# Patient Record
Sex: Female | Born: 1980 | Race: White | Hispanic: No | Marital: Single | Smoking: Current every day smoker
Health system: Southern US, Community
[De-identification: ages and names within clinical notes are randomized; demographics above are authoritative.]

## PROBLEM LIST (undated history)

## (undated) DIAGNOSIS — F410 Panic disorder [episodic paroxysmal anxiety] without agoraphobia: Secondary | ICD-10-CM

## (undated) DIAGNOSIS — F419 Anxiety disorder, unspecified: Secondary | ICD-10-CM

---

## 2005-03-18 ENCOUNTER — Other Ambulatory Visit: Admission: RE | Admit: 2005-03-18 | Discharge: 2005-03-18 | Payer: Self-pay

## 2009-05-08 ENCOUNTER — Other Ambulatory Visit: Admission: RE | Admit: 2009-05-08 | Discharge: 2009-05-08 | Payer: Self-pay | Admitting: Unknown Physician Specialty

## 2011-04-09 ENCOUNTER — Emergency Department (HOSPITAL_COMMUNITY)
Admission: EM | Admit: 2011-04-09 | Discharge: 2011-04-09 | Disposition: A | Discharge status: Home / Self Care | Payer: Self-pay | Attending: Emergency Medicine | Admitting: Emergency Medicine

## 2011-04-09 ENCOUNTER — Encounter (HOSPITAL_COMMUNITY): Payer: Self-pay | Admitting: *Deleted

## 2011-04-09 DIAGNOSIS — F17200 Nicotine dependence, unspecified, uncomplicated: Secondary | ICD-10-CM | POA: Insufficient documentation

## 2011-04-09 DIAGNOSIS — K089 Disorder of teeth and supporting structures, unspecified: Secondary | ICD-10-CM | POA: Insufficient documentation

## 2011-04-09 DIAGNOSIS — G8929 Other chronic pain: Secondary | ICD-10-CM | POA: Insufficient documentation

## 2011-04-09 MED ORDER — IBUPROFEN 800 MG PO TABS
800.0000 mg | ORAL_TABLET | Freq: Once | ORAL | Status: AC
  Administered 2011-04-09: 800 mg via ORAL
  Filled 2011-04-09: qty 1

## 2011-04-09 MED ORDER — HYDROCODONE-ACETAMINOPHEN 5-325 MG PO TABS
1.0000 | ORAL_TABLET | Freq: Once | ORAL | Status: AC
  Administered 2011-04-09: 1 via ORAL
  Filled 2011-04-09: qty 1

## 2011-04-09 MED ORDER — PENICILLIN V POTASSIUM 500 MG PO TABS: 500.0000 mg | ORAL_TABLET | Freq: Four times a day (QID) | ORAL | Status: AC

## 2011-04-09 MED ORDER — HYDROCODONE-ACETAMINOPHEN 5-325 MG PO TABS: 1.0000 | ORAL_TABLET | Freq: Four times a day (QID) | ORAL | Status: AC | PRN

## 2011-04-09 MED ORDER — PENICILLIN V POTASSIUM 250 MG PO TABS
500.0000 mg | ORAL_TABLET | Freq: Once | ORAL | Status: AC
  Administered 2011-04-09: 500 mg via ORAL
  Filled 2011-04-09: qty 2

## 2011-04-09 NOTE — ED Provider Notes (Signed)
History     CSN: [id number]  Arrival date & time 04/09/11  1558   First MD Initiated Contact with Patient 04/09/11 1634      Chief Complaint  Patient presents with  . Dental Pain    (Consider location/radiation/quality/duration/timing/severity/associated sxs/prior treatment) HPI Comments: Pt plans to go to the next free dental clinic either in [place] or [place].  ? Dates.  Patient is a 31 y.o. female presenting with tooth pain. The history is provided by the patient. No language interpreter was used.  Dental PainThe primary symptoms include mouth pain. Primary symptoms do not include dental injury. Episode onset: began several weeks ago but getting worse in past few days. The symptoms are worsening. The symptoms are chronic. The symptoms occur constantly.  Additional symptoms include: dental sensitivity to temperature and jaw pain. Additional symptoms do not include: facial swelling.    History reviewed. No pertinent past medical history.  History reviewed. No pertinent past surgical history.  No family history on file.  History  Substance Use Topics  . Smoking status: Current Everyday Smoker  . Smokeless tobacco: Not on file  . Alcohol Use: No    OB History    Grav Para Term Preterm Abortions TAB SAB Ect Mult Living                  Review of Systems  HENT: Positive for dental problem. Negative for facial swelling.   All other systems reviewed and are negative.    Allergies  Review of patient's allergies indicates no known allergies.  Home Medications  No current outpatient prescriptions on file.  BP 112/85  Pulse 89  Temp(Src) 98.9 F (37.2 C) (Oral)  Resp 16  Ht 5' (1.524 m)  Wt 130 lb (58.968 kg)  BMI 25.39 kg/m2  SpO2 99%  Physical Exam  Nursing note and vitals reviewed. Constitutional: She is oriented to person, place, and time. She appears well-developed and well-nourished. No distress.  HENT:  Head: Normocephalic and atraumatic.    Mouth/Throat: Oropharynx is clear and moist and mucous membranes are normal. Dental caries present. No uvula swelling.    Eyes: EOM are normal.  Neck: Normal range of motion.  Cardiovascular: Normal rate, regular rhythm and normal heart sounds.   Pulmonary/Chest: Effort normal and breath sounds normal.  Abdominal: Soft. She exhibits no distension. There is no tenderness.  Musculoskeletal: Normal range of motion.  Neurological: She is alert and oriented to person, place, and time.  Skin: Skin is warm and dry.  Psychiatric: She has a normal mood and affect. Judgment normal.    ED Course  Procedures (including critical care time)  Labs Reviewed - No data to display No results found.   No diagnosis found.    MDM          Worthy Rancher, PA 04/09/11 [phone]

## 2011-04-09 NOTE — Discharge Instructions (Signed)
Dental Pain A tooth ache may be caused by cavities (tooth decay). Cavities expose the nerve of the tooth to air and hot or cold temperatures. It may come from an infection or abscess (also called a boil or furuncle) around your tooth. It is also often caused by dental caries (tooth decay). This causes the pain you are having. DIAGNOSIS  Your caregiver can diagnose this problem by exam. TREATMENT   If caused by an infection, it may be treated with medications which kill germs (antibiotics) and pain medications as prescribed by your caregiver. Take medications as directed.   Only take over-the-counter or prescription medicines for pain, discomfort, or fever as directed by your caregiver.   Whether the tooth ache today is caused by infection or dental disease, you should see your dentist as soon as possible for further care.  SEEK MEDICAL CARE IF: The exam and treatment you received today has been provided on an emergency basis only. This is not a substitute for complete medical or dental care. If your problem worsens or new problems (symptoms) appear, and you are unable to meet with your dentist, call or return to this location. SEEK IMMEDIATE MEDICAL CARE IF:   You have a fever.   You develop redness and swelling of your face, jaw, or neck.   You are unable to open your mouth.   You have severe pain uncontrolled by pain medicine.  MAKE SURE YOU:   Understand these instructions.   Will watch your condition.   Will get help right away if you are not doing well or get worse.  Document Released: 02/03/2005 Document Revised: 10/16/2010 Document Reviewed: 09/22/2007 [facility] Patient Information 2012 [place], [place].   Take the meds as directed.  Take ibuprofen up to 800 mg every 8 hrs with food.  Follow up with the dentist of your choice.

## 2011-04-09 NOTE — ED Notes (Signed)
Dental pain x 2 weeks.

## 2011-04-09 NOTE — ED Notes (Signed)
Pt states front teeth and back left teeth pain. Pt states teeth pain started about 3 weeks ago. Has taken over the counter medication without relief.

## 2011-04-09 NOTE — ED Provider Notes (Signed)
Medical screening examination/treatment/procedure(s) were performed by non-physician practitioner and as supervising physician I was immediately available for consultation/collaboration.   Andreah Goheen L Treshaun Carrico, MD 04/09/11 2239 

## 2011-12-15 ENCOUNTER — Emergency Department (HOSPITAL_COMMUNITY)
Admission: EM | Admit: 2011-12-15 | Discharge: 2011-12-15 | Disposition: A | Discharge status: Home / Self Care | Payer: Self-pay | Attending: Emergency Medicine | Admitting: Emergency Medicine

## 2011-12-15 ENCOUNTER — Encounter (HOSPITAL_COMMUNITY): Payer: Self-pay | Admitting: *Deleted

## 2011-12-15 DIAGNOSIS — F410 Panic disorder [episodic paroxysmal anxiety] without agoraphobia: Secondary | ICD-10-CM | POA: Insufficient documentation

## 2011-12-15 DIAGNOSIS — F419 Anxiety disorder, unspecified: Secondary | ICD-10-CM

## 2011-12-15 DIAGNOSIS — F17200 Nicotine dependence, unspecified, uncomplicated: Secondary | ICD-10-CM | POA: Insufficient documentation

## 2011-12-15 DIAGNOSIS — Z79899 Other long term (current) drug therapy: Secondary | ICD-10-CM | POA: Insufficient documentation

## 2011-12-15 HISTORY — DX: Anxiety disorder, unspecified: F41.9

## 2011-12-15 HISTORY — DX: Panic disorder (episodic paroxysmal anxiety): F41.0

## 2011-12-15 MED ORDER — LORAZEPAM 1 MG PO TABS
1.0000 mg | ORAL_TABLET | Freq: Once | ORAL | Status: AC
  Administered 2011-12-15: 1 mg via ORAL
  Filled 2011-12-15: qty 1

## 2011-12-15 MED ORDER — LORAZEPAM 1 MG PO TABS: 1.0000 mg | ORAL_TABLET | Freq: Three times a day (TID) | ORAL | Status: DC | PRN

## 2011-12-15 NOTE — ED Provider Notes (Signed)
History     CSN: [id number]  Arrival date & time 12/15/11  1632   First MD Initiated Contact with Patient 12/15/11 1651      Chief Complaint  Patient presents with  . Panic Attack     HPI Pt was seen at 1700.  Per pt, c/o gradual onset and persistence of constant "panic attack" that began today PTA.  Pt describes her usual panic attacks as her "hands and mouth cramping" as well as "grinding her teeth."  Pt states her symptoms began after she ran out of her xanax, LD yesterday. Denies HI, no SI, no CP/SOB, no abd pain, no N/V/D, no fevers.      Past Medical History  Diagnosis Date  . Panic attack   . Anxiety   . Anxiety   . Panic attack     History reviewed. No pertinent past surgical history.   History  Substance Use Topics  . Smoking status: Current Every Day Smoker  . Smokeless tobacco: Not on file  . Alcohol Use: No      Review of Systems ROS: Statement: All systems negative except as marked or noted in the HPI; Constitutional: Negative for fever and chills. ; ; Eyes: Negative for eye pain, redness and discharge. ; ; ENMT: Negative for ear pain, hoarseness, nasal congestion, sinus pressure and sore throat. ; ; Cardiovascular: Negative for chest pain, palpitations, diaphoresis, dyspnea and peripheral edema. ; ; Respiratory: Negative for cough, wheezing and stridor. ; ; Gastrointestinal: Negative for nausea, vomiting, diarrhea, abdominal pain, blood in stool, hematemesis, jaundice and rectal bleeding. . ; ; Genitourinary: Negative for dysuria, flank pain and hematuria. ; ; Musculoskeletal: Negative for back pain and neck pain. Negative for swelling and trauma.; ; Skin: Negative for pruritus, rash, abrasions, blisters, bruising and skin lesion.; ; Neuro: Negative for headache, lightheadedness and neck stiffness. Negative for weakness, altered level of consciousness , altered mental status, extremity weakness, paresthesias, involuntary movement, seizure and syncope.; Psych:   +anxiety, panic attack. No SI, no SA, no HI, no hallucinations.        Allergies  Review of patient's allergies indicates no known allergies.  Home Medications   Current Outpatient Rx  Name Route Sig Dispense Refill  . ACETAMINOPHEN 500 MG PO TABS Oral Take 500-2,000 mg by mouth daily as needed. For pain    . ALPRAZOLAM 1 MG PO TABS Oral Take 1 mg by mouth 3 (three) times daily as needed. For anxiety    . DULOXETINE HCL 30 MG PO CPEP Oral Take 30 mg by mouth daily.    Marland Kitchen RISPERIDONE 1 MG PO TABS Oral Take 1 mg by mouth at bedtime.    . TRAZODONE HCL 150 MG PO TABS Oral Take 150 mg by mouth at bedtime.    Marland Kitchen LORAZEPAM 1 MG PO TABS Oral Take 1 tablet (1 mg total) by mouth 3 (three) times daily as needed for anxiety. 9 tablet 0    BP 108/68  Pulse 76  Resp 16  Ht 5\' 4"  (1.626 m)  Wt 143 lb (64.864 kg)  BMI 24.55 kg/m2  SpO2 100%  Physical Exam 1705: Physical examination:  Nursing notes reviewed; Vital signs and O2 SAT reviewed;  Constitutional: Well developed, Well nourished, Well hydrated, In no acute distress; Head:  Normocephalic, atraumatic; Eyes: EOMI, PERRL, No scleral icterus; ENMT: Mouth and pharynx normal, Mucous membranes moist; Neck: Supple, Full range of motion, No lymphadenopathy; Cardiovascular: Regular rate and rhythm, No murmur, rub, or gallop; Respiratory: Breath  sounds clear & equal bilaterally, No rales, rhonchi, wheezes.  Speaking full sentences with ease, Normal respiratory effort/excursion; Chest: Nontender, Movement normal; No CVA tenderness; Extremities: Pulses normal, No tenderness, No edema, No calf edema or asymmetry.; Neuro: AA&Ox3, Major CN grossly intact.  Speech clear. No gross focal motor or sensory deficits in extremities.; Skin: Color normal, Warm, Dry.; Psych:  Affect flat, poor eye contact.    ED Course  Procedures   MDM  MDM Reviewed: nursing note and vitals     1715:  Pt states she has been out of her xanax since yesterday to myself and ED  RN, but told pharm tech she has not had xanax for approx 1 week.  States she is "having a panic attack" today.  Pt is chewing on the end of a toothbrush because "I don't want to swallow my tongue" as well as "I grind my teeth" when she is upset.  Will dose ativan.  1945:  Pt has been ambulatory around the ED with steady gait and easy resps.  Wants to leave now.  States she feels "better."  Continues to deny SI. Dx d/w pt and family.  Questions answered.  Verb understanding, agreeable to d/c home with outpt f/u with her mental health provider.        Laray Anger, DO 12/18/11 1252

## 2011-12-15 NOTE — ED Notes (Signed)
Pt states has had anxiety attack x 2 hrs. Pt found sitting [place] style on stretcher, with left arm bent over the top of her head and the end of a tooth brush in her mouth, pt states " toothbrush in  mouth is to prevent me from swallowing my tongue and my arm and hand keeps cramping up". Panic attack started 2 hrs ago because of pain in her mouth and hand per pt. Pt states first she took a xanax yesterday and later stated she took one today, however told pharmacy tech she hadn't had any xanax for a week. No SOB, hyperventilation or distress noted.  EDP notified of pt.

## 2011-12-15 NOTE — ED Notes (Signed)
Family at bedside. Patient is waiting on discharge 

## 2011-12-15 NOTE — ED Notes (Signed)
Family at bedside. Patient states she is in pain RN aware.

## 2011-12-15 NOTE — ED Notes (Signed)
Pt out to desk, states feels better. "was wondering about DC papers.

## 2011-12-15 NOTE — ED Notes (Signed)
Anxiety for 2 hours with cramping of hands, and mouth.

## 2011-12-18 ENCOUNTER — Encounter (HOSPITAL_COMMUNITY): Payer: Self-pay | Admitting: Emergency Medicine

## 2012-05-07 ENCOUNTER — Encounter (HOSPITAL_COMMUNITY): Payer: Self-pay | Admitting: *Deleted

## 2012-05-07 ENCOUNTER — Emergency Department (HOSPITAL_COMMUNITY)
Admission: EM | Admit: 2012-05-07 | Discharge: 2012-05-07 | Disposition: A | Discharge status: Home / Self Care | Payer: Self-pay | Attending: Emergency Medicine | Admitting: Emergency Medicine

## 2012-05-07 DIAGNOSIS — F411 Generalized anxiety disorder: Secondary | ICD-10-CM | POA: Insufficient documentation

## 2012-05-07 DIAGNOSIS — Z79899 Other long term (current) drug therapy: Secondary | ICD-10-CM | POA: Insufficient documentation

## 2012-05-07 DIAGNOSIS — K029 Dental caries, unspecified: Secondary | ICD-10-CM | POA: Insufficient documentation

## 2012-05-07 DIAGNOSIS — F410 Panic disorder [episodic paroxysmal anxiety] without agoraphobia: Secondary | ICD-10-CM | POA: Insufficient documentation

## 2012-05-07 DIAGNOSIS — F17200 Nicotine dependence, unspecified, uncomplicated: Secondary | ICD-10-CM | POA: Insufficient documentation

## 2012-05-07 DIAGNOSIS — R220 Localized swelling, mass and lump, head: Secondary | ICD-10-CM | POA: Insufficient documentation

## 2012-05-07 DIAGNOSIS — R51 Headache: Secondary | ICD-10-CM | POA: Insufficient documentation

## 2012-05-07 DIAGNOSIS — R221 Localized swelling, mass and lump, neck: Secondary | ICD-10-CM | POA: Insufficient documentation

## 2012-05-07 DIAGNOSIS — K047 Periapical abscess without sinus: Secondary | ICD-10-CM | POA: Insufficient documentation

## 2012-05-07 MED ORDER — LIDOCAINE HCL (PF) 2 % IJ SOLN
INTRAMUSCULAR | Status: AC
  Administered 2012-05-07: 2.1 mL
  Filled 2012-05-07: qty 10

## 2012-05-07 MED ORDER — CEFTRIAXONE SODIUM 1 G IJ SOLR
1.0000 g | Freq: Once | INTRAMUSCULAR | Status: AC
  Administered 2012-05-07: 1 g via INTRAMUSCULAR
  Filled 2012-05-07: qty 10

## 2012-05-07 MED ORDER — HYDROCODONE-ACETAMINOPHEN 5-325 MG PO TABS: 1.0000 | ORAL_TABLET | ORAL | Status: DC | PRN

## 2012-05-07 MED ORDER — AMOXICILLIN 500 MG PO CAPS: 500.0000 mg | ORAL_CAPSULE | Freq: Three times a day (TID) | ORAL | Status: DC

## 2012-05-07 MED ORDER — IBUPROFEN 600 MG PO TABS: 600.0000 mg | ORAL_TABLET | Freq: Four times a day (QID) | ORAL | Status: DC | PRN

## 2012-05-07 MED ORDER — KETOROLAC TROMETHAMINE 60 MG/2ML IM SOLN
30.0000 mg | Freq: Once | INTRAMUSCULAR | Status: AC
  Administered 2012-05-07: 30 mg via INTRAMUSCULAR
  Filled 2012-05-07: qty 2

## 2012-05-07 NOTE — ED Provider Notes (Signed)
Medical screening examination/treatment/procedure(s) were performed by non-physician practitioner and as supervising physician I was immediately available for consultation/collaboration.   Ellary Casamento L Linea Calles, MD 05/07/12 1636 

## 2012-05-07 NOTE — ED Notes (Signed)
Pt reports extreme dental pain/problems for the past 9 months - teeth breaking, pain, swelling to face - more severe the past two days. Is seeking assistance from health department and trying to obtain medicaid.

## 2012-05-07 NOTE — ED Provider Notes (Signed)
History     CSN: [id number]  Arrival date & time 05/07/12  1540   First MD Initiated Contact with Patient 05/07/12 1549      Chief Complaint  Patient presents with  . Dental Pain    (Consider location/radiation/quality/duration/timing/severity/associated sxs/prior treatment) HPI Kendra Ho is a 32 y.o. female who presents to the ED with dental pain. The pain started yesterday. Today she has swelling and increased pain on the right side of her face. She has not had fever or chills. She rates the pain as 9/10. She denies any other problems. The history was provided by the patient.  Past Medical History  Diagnosis Date  . Panic attack   . Anxiety   . Anxiety   . Panic attack     History reviewed. No pertinent past surgical history.  History reviewed. No pertinent family history.  History  Substance Use Topics  . Smoking status: Current Every Day Smoker  . Smokeless tobacco: Not on file  . Alcohol Use: No    OB History   Grav Para Term Preterm Abortions TAB SAB Ect Mult Living                  Review of Systems  Constitutional: Negative for fever and chills.  HENT: Positive for dental problem. Negative for neck pain.   Gastrointestinal: Negative for nausea, vomiting and abdominal pain.  Skin: Negative for rash.  Neurological: Positive for headaches (due to dental pain).  Psychiatric/Behavioral: Negative for confusion. The patient is not nervous/anxious.     Allergies  Review of patient's allergies indicates no known allergies.  Home Medications   Current Outpatient Rx  Name  Route  Sig  Dispense  Refill  . acetaminophen (TYLENOL) 500 MG tablet   Oral   Take 500-2,000 mg by mouth daily as needed. For pain         . ALPRAZolam (XANAX) 1 MG tablet   Oral   Take 1 mg by mouth 3 (three) times daily as needed. For anxiety         . DULoxetine (CYMBALTA) 30 MG capsule   Oral   Take 30 mg by mouth daily.         Marland Kitchen LORazepam (ATIVAN) 1 MG  tablet   Oral   Take 1 tablet (1 mg total) by mouth 3 (three) times daily as needed for anxiety.   9 tablet   0   . risperiDONE (RISPERDAL) 1 MG tablet   Oral   Take 1 mg by mouth at bedtime.         . traZODone (DESYREL) 150 MG tablet   Oral   Take 150 mg by mouth at bedtime.           BP 114/79  Pulse 76  Temp(Src) 99 F (37.2 C) (Oral)  Resp 18  Ht 5' (1.524 m)  Wt 130 lb (58.968 kg)  BMI 25.39 kg/m2  SpO2 99%  Physical Exam  Nursing note and vitals reviewed. Constitutional: She is oriented to person, place, and time. She appears well-developed and well-nourished. No distress.  Appears uncomfortable.  HENT:  Head: Normocephalic and atraumatic.    Right Ear: Tympanic membrane normal.  Left Ear: Tympanic membrane normal.  Nose: Nose normal.  Mouth/Throat: Uvula is midline, oropharynx is clear and moist and mucous membranes are normal. Dental abscesses and dental caries present.    Facial swelling and tenderness on exam  Eyes: Conjunctivae and EOM are normal. Pupils are equal, round,  and reactive to light.  Neck: Neck supple.  Cardiovascular: Normal rate.   Pulmonary/Chest: Effort normal.  Musculoskeletal: Normal range of motion. She exhibits no edema.  Neurological: She is alert and oriented to person, place, and time. No cranial nerve deficit.  Skin: Skin is warm and dry.  Psychiatric: She has a normal mood and affect. Her behavior is normal. Judgment and thought content normal.   Assessment: 32 y.o. female with dental abscess right upper first molar   Facial swelling  Plan:  Rocephin 1 gram IM   Toradol 30 mg IM   Rx antibiotics   Rx pain medicine   Follow up with Dentist ASAP   Return if symptoms worsen  ED Course  Procedures (including critical care time)   MDM  Discussed with the patient and all questioned fully answered. She will return if any problems arise.    Medication List    TAKE these medications       amoxicillin 500 MG capsule   Commonly known as:  AMOXIL  Take 1 capsule (500 mg total) by mouth 3 (three) times daily.     HYDROcodone-acetaminophen 5-325 MG per tablet  Commonly known as:  NORCO/VICODIN  Take 1 tablet by mouth every 4 (four) hours as needed.     ibuprofen 600 MG tablet  Commonly known as:  ADVIL,MOTRIN  Take 1 tablet (600 mg total) by mouth every 6 (six) hours as needed for pain.      ASK your doctor about these medications       acetaminophen 500 MG tablet  Commonly known as:  TYLENOL  Take 500-2,000 mg by mouth daily as needed. For pain     ALPRAZolam 1 MG tablet  Commonly known as:  XANAX  Take 1 mg by mouth 3 (three) times daily as needed. For anxiety     DULoxetine 30 MG capsule  Commonly known as:  CYMBALTA  Take 30 mg by mouth daily.     LORazepam 1 MG tablet  Commonly known as:  ATIVAN  Take 1 tablet (1 mg total) by mouth 3 (three) times daily as needed for anxiety.     risperiDONE 1 MG tablet  Commonly known as:  RISPERDAL  Take 1 mg by mouth at bedtime.     traZODone 150 MG tablet  Commonly known as:  DESYREL  Take 150 mg by mouth at bedtime.                 Janne Napoleon, [place] 05/07/12 1635

## 2012-05-07 NOTE — ED Notes (Signed)
Dental pain with swelling rt side of face.

## 2012-07-22 ENCOUNTER — Encounter (HOSPITAL_COMMUNITY): Payer: Self-pay | Admitting: Emergency Medicine

## 2012-07-22 ENCOUNTER — Emergency Department (HOSPITAL_COMMUNITY)
Admission: EM | Admit: 2012-07-22 | Discharge: 2012-07-22 | Disposition: A | Discharge status: Home / Self Care | Payer: Self-pay | Attending: Emergency Medicine | Admitting: Emergency Medicine

## 2012-07-22 DIAGNOSIS — Z79899 Other long term (current) drug therapy: Secondary | ICD-10-CM | POA: Insufficient documentation

## 2012-07-22 DIAGNOSIS — F411 Generalized anxiety disorder: Secondary | ICD-10-CM | POA: Insufficient documentation

## 2012-07-22 DIAGNOSIS — K029 Dental caries, unspecified: Secondary | ICD-10-CM | POA: Insufficient documentation

## 2012-07-22 DIAGNOSIS — Z792 Long term (current) use of antibiotics: Secondary | ICD-10-CM | POA: Insufficient documentation

## 2012-07-22 DIAGNOSIS — K0889 Other specified disorders of teeth and supporting structures: Secondary | ICD-10-CM

## 2012-07-22 DIAGNOSIS — K089 Disorder of teeth and supporting structures, unspecified: Secondary | ICD-10-CM | POA: Insufficient documentation

## 2012-07-22 DIAGNOSIS — F17200 Nicotine dependence, unspecified, uncomplicated: Secondary | ICD-10-CM | POA: Insufficient documentation

## 2012-07-22 MED ORDER — HYDROCODONE-ACETAMINOPHEN 5-325 MG PO TABS
ORAL_TABLET | ORAL | Status: DC
Start: 2012-07-22 — End: 2018-01-14

## 2012-07-22 MED ORDER — PENICILLIN V POTASSIUM 250 MG PO TABS
500.0000 mg | ORAL_TABLET | Freq: Once | ORAL | Status: AC
  Administered 2012-07-22: 500 mg via ORAL
  Filled 2012-07-22: qty 2

## 2012-07-22 MED ORDER — OXYCODONE-ACETAMINOPHEN 5-325 MG PO TABS
1.0000 | ORAL_TABLET | Freq: Once | ORAL | Status: AC
  Administered 2012-07-22: 1 via ORAL
  Filled 2012-07-22: qty 1

## 2012-07-22 MED ORDER — IBUPROFEN 400 MG PO TABS
400.0000 mg | ORAL_TABLET | Freq: Once | ORAL | Status: AC
  Administered 2012-07-22: 400 mg via ORAL
  Filled 2012-07-22: qty 1

## 2012-07-22 MED ORDER — PENICILLIN V POTASSIUM 250 MG PO TABS: 250.0000 mg | ORAL_TABLET | Freq: Four times a day (QID) | ORAL | Status: DC

## 2012-07-22 MED ORDER — NAPROXEN 250 MG PO TABS: 250.0000 mg | ORAL_TABLET | Freq: Two times a day (BID) | ORAL | Status: DC

## 2012-07-22 NOTE — ED Notes (Signed)
Patient complaining of toothache to right side.

## 2012-07-22 NOTE — ED Provider Notes (Signed)
History     CSN: [id number]  Arrival date & time 07/22/12  2237   First MD Initiated Contact with Patient 07/22/12 2250      Chief Complaint  Patient presents with  . Dental Pain    HPI Pt was seen at 2250.  Per pt, c/o gradual onset and persistence of constant right upper teeth "pain" for several months, worse over the past several days.  Denies fevers, no intra-oral edema, no rash, no facial swelling, no dysphagia, no neck pain.   The condition is aggravated by nothing. The condition is relieved by nothing. The symptoms have been associated with no other complaints. The patient has no significant history of serious medical conditions.     Past Medical History  Diagnosis Date  . Anxiety   . Panic attack     History reviewed. No pertinent past surgical history.   History  Substance Use Topics  . Smoking status: Current Every Day Smoker  . Smokeless tobacco: Not on file  . Alcohol Use: No      Review of Systems ROS: Statement: All systems negative except as marked or noted in the HPI; Constitutional: Negative for fever and chills. ; ; Eyes: Negative for eye pain and discharge. ; ; ENMT: Positive for dental caries, dental hygiene poor and toothache. Negative for ear pain, bleeding gums, dental injury, facial deformity, facial swelling, hoarseness, nasal congestion, sinus pressure, sore throat, throat swelling and tongue swollen. ; ; Cardiovascular: Negative for chest pain, palpitations, diaphoresis, dyspnea and peripheral edema. ; ; Respiratory: Negative for cough, wheezing and stridor. ; ; Gastrointestinal: Negative for nausea, vomiting, diarrhea and abdominal pain. ; ; Genitourinary: Negative for dysuria, flank pain and hematuria. ; ; Musculoskeletal: Negative for back pain and neck pain. ; ; Skin: Negative for rash and skin lesion. ; ; Neuro: Negative for headache, lightheadedness and neck stiffness. ;     Allergies  Review of patient's allergies indicates no known  allergies.  Home Medications   Current Outpatient Rx  Name  Route  Sig  Dispense  Refill  . acetaminophen (TYLENOL) 500 MG tablet   Oral   Take 500-2,000 mg by mouth daily as needed. For pain         . ALPRAZolam (XANAX) 1 MG tablet   Oral   Take 1 mg by mouth 3 (three) times daily as needed. For anxiety         . amoxicillin (AMOXIL) 500 MG capsule   Oral   Take 1 capsule (500 mg total) by mouth 3 (three) times daily.   21 capsule   0   . DULoxetine (CYMBALTA) 30 MG capsule   Oral   Take 30 mg by mouth daily.         Marland Kitchen HYDROcodone-acetaminophen (NORCO/VICODIN) 5-325 MG per tablet   Oral   Take 1 tablet by mouth every 4 (four) hours as needed.   20 tablet   0   . HYDROcodone-acetaminophen (NORCO/VICODIN) 5-325 MG per tablet      1 or 2 tabs PO q6 hours prn pain   20 tablet   0   . ibuprofen (ADVIL,MOTRIN) 600 MG tablet   Oral   Take 1 tablet (600 mg total) by mouth every 6 (six) hours as needed for pain.   30 tablet   0   . LORazepam (ATIVAN) 1 MG tablet   Oral   Take 1 tablet (1 mg total) by mouth 3 (three) times daily as needed for anxiety.  9 tablet   0   . naproxen (NAPROSYN) 250 MG tablet   Oral   Take 1 tablet (250 mg total) by mouth 2 (two) times daily with a meal.   14 tablet   0   . penicillin v potassium (VEETID) 250 MG tablet   Oral   Take 1 tablet (250 mg total) by mouth 4 (four) times daily.   20 tablet   0   . risperiDONE (RISPERDAL) 1 MG tablet   Oral   Take 1 mg by mouth at bedtime.         . traZODone (DESYREL) 150 MG tablet   Oral   Take 150 mg by mouth at bedtime.           BP 136/74  Pulse 78  Temp(Src) 98.1 F (36.7 C) (Oral)  Resp 20  Ht 5' (1.524 m)  Wt 140 lb (63.504 kg)  BMI 27.34 kg/m2  SpO2 100%  Physical Exam 2255: Physical examination: Vital signs and O2 SAT: Reviewed; Constitutional: Well developed, Well nourished, Well hydrated, In no acute distress; Head and Face: Normocephalic, Atraumatic;  Eyes: EOMI, PERRL, No scleral icterus; ENMT: Mouth and pharynx normal, Poor dentition, Widespread dental decay, Left TM normal, Right TM normal, Mucous membranes moist, +upper right 1st and 2nd molars with extensive dental decay.  No gingival erythema, edema, fluctuance, or drainage.  No intra-oral edema. No submandibular or sublingual edema. No hoarse voice, no drooling, no stridor.  ; Neck: Supple, Full range of motion, No lymphadenopathy; Cardiovascular: Regular rate and rhythm, No murmur, rub, or gallop; Respiratory: Breath sounds clear & equal bilaterally, No rales, rhonchi, wheezes, Normal respiratory effort/excursion; Chest: Nontender, Movement normal; Extremities: Pulses normal, No tenderness, No edema; Neuro: AA&Ox3, Major CN grossly intact.  No gross focal motor or sensory deficits in extremities.; Skin: Color normal, No rash, No petechiae, Warm, Dry   ED Course  Procedures     MDM  MDM Reviewed: previous chart, nursing note and vitals     2300:  Pt encouraged to f/u with dentist or oral surgeon for her dental needs for good continuity of care and definitive treatment.  Verb understanding.         Laray Anger, DO 07/23/12 [phone]

## 2013-09-28 ENCOUNTER — Emergency Department: Payer: Self-pay | Admitting: Student

## 2015-08-18 IMAGING — CT CT MAXILLOFACIAL WITHOUT CONTRAST
3 of 5 series · 16 of 47 positions shown, 19 images · non-contrast
Comparison: None.

CLINICAL DATA: Facial trauma secondary to a fall. Bruising to the
nasal bridge and under the left eye. Previous nasal fracture.

EXAM:
CT MAXILLOFACIAL WITHOUT CONTRAST
TECHNIQUE: Multidetector CT imaging of the maxillofacial structures was
performed. Multiplanar CT image reconstructions were also generated.
A small metallic BB was placed on the right temple in order to
reliably differentiate right from left.

[Series 2: max soft · axial · 0.32mm/px · z∈[+550,+670]mm · 11 of 70 slices shown, 14 images]
[im 5/70  brain]
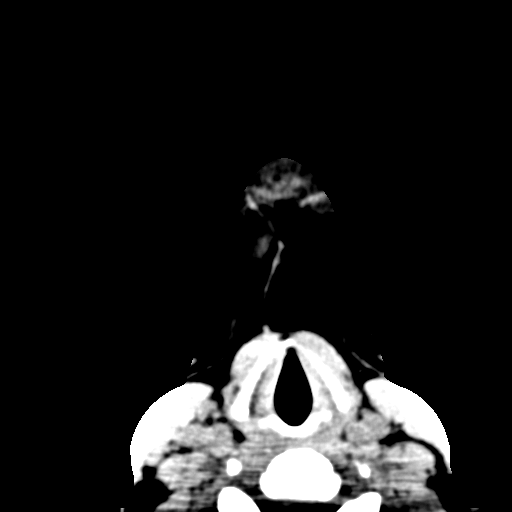
[im 5/70  bone]
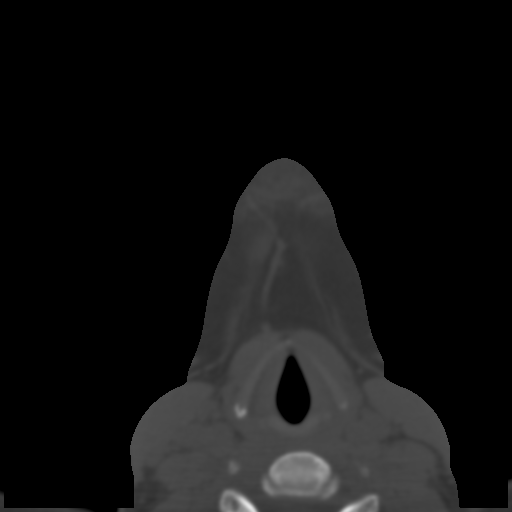
[im 10/70  bone]
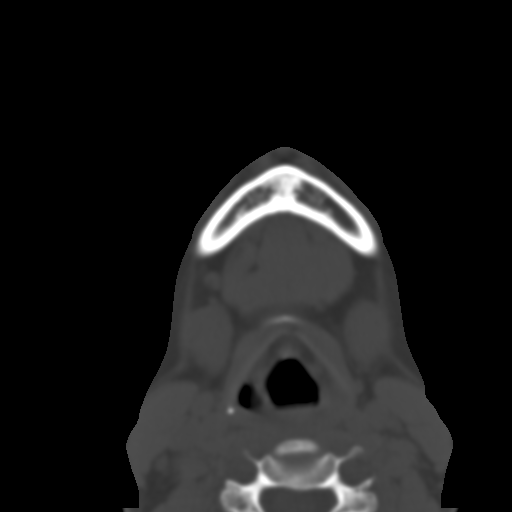
[im 17/70  bone]
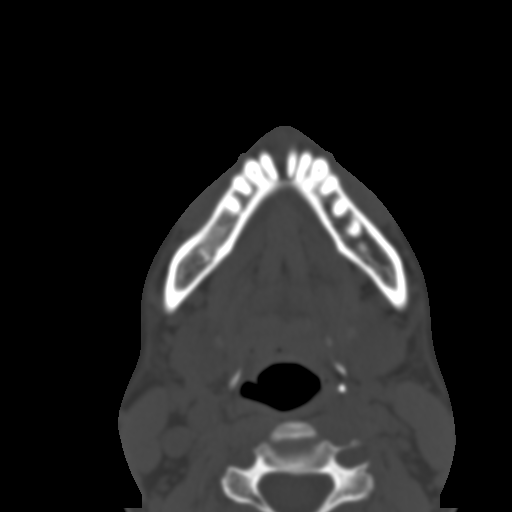
[im 22/70  bone]
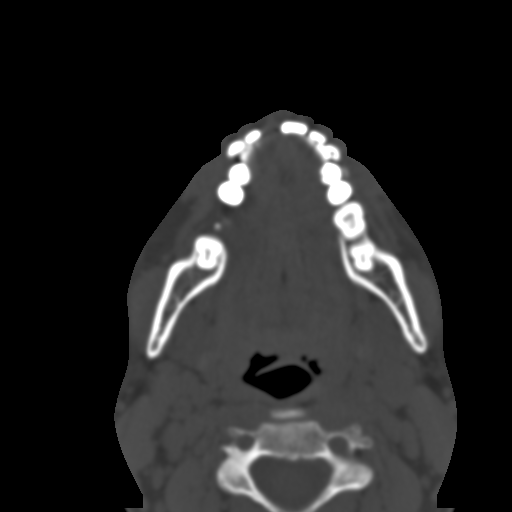
[im 29/70  brain]
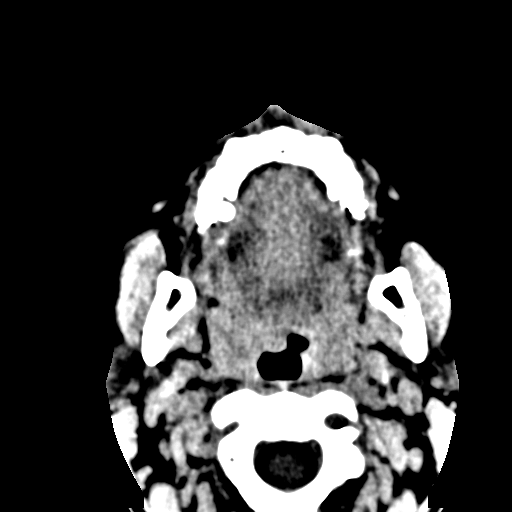
[im 29/70  bone]
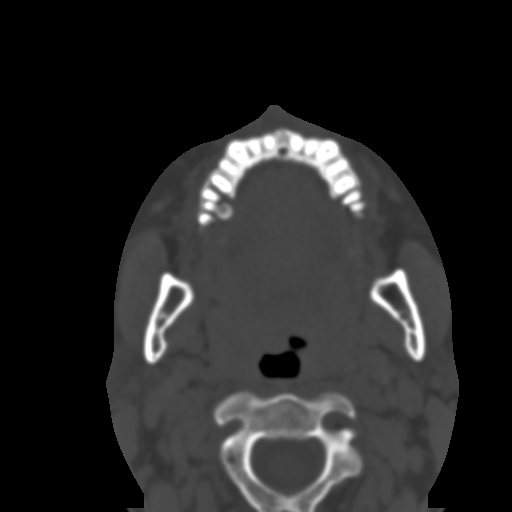
[im 36/70  bone]
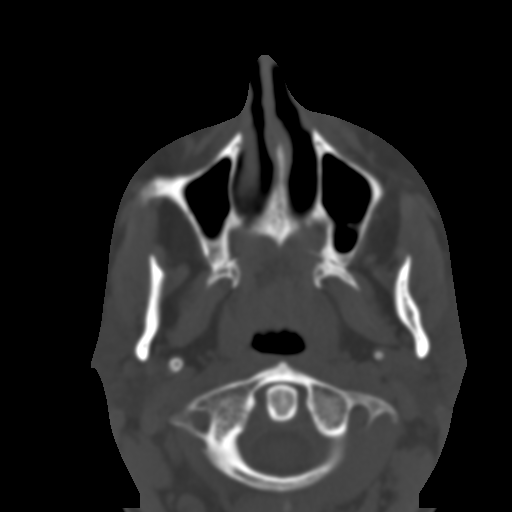
[im 41/70  bone]
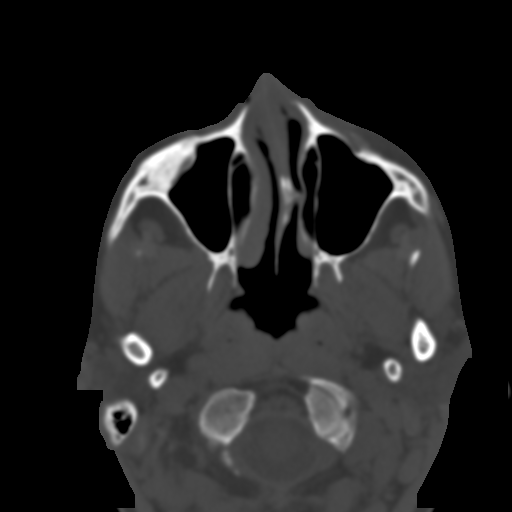
[im 48/70  bone]
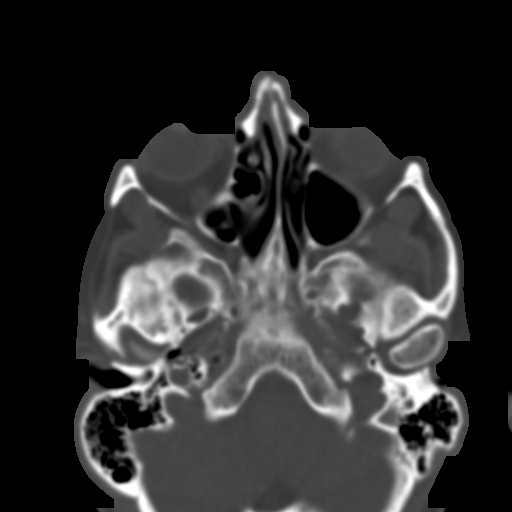
[im 53/70  brain]
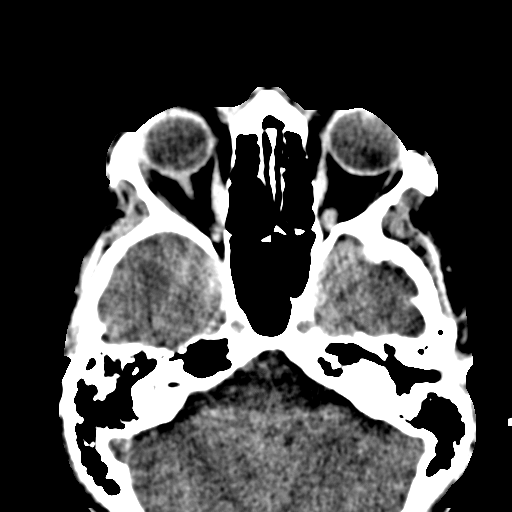
[im 53/70  bone]
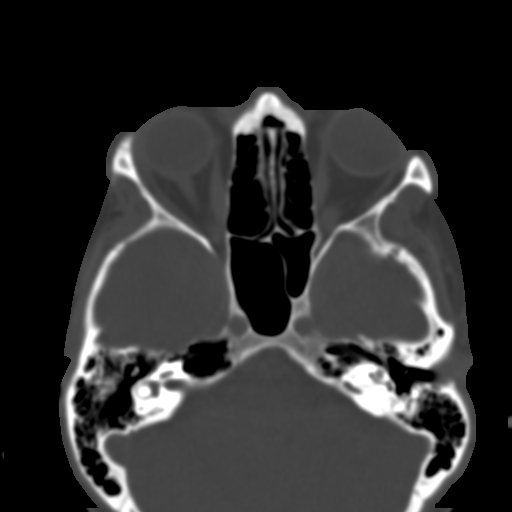
[im 60/70  bone]
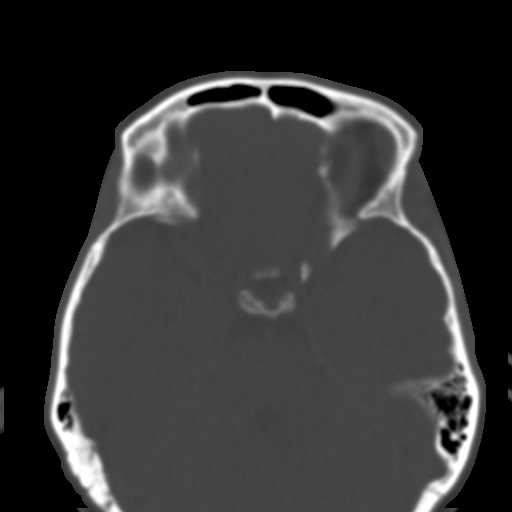
[im 65/70  bone]
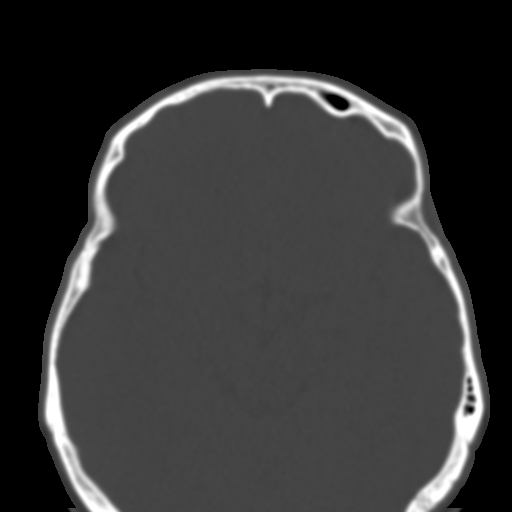

[Series 6: coronal bone · coronal · 0.31mm/px · 3 of 79 slices shown]
[im 20/79  bone]
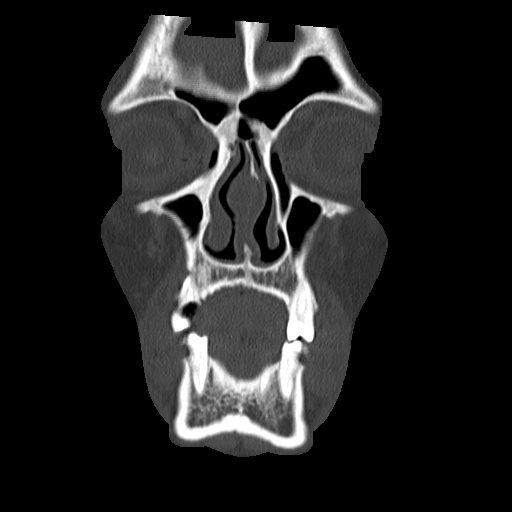
[im 40/79  bone]
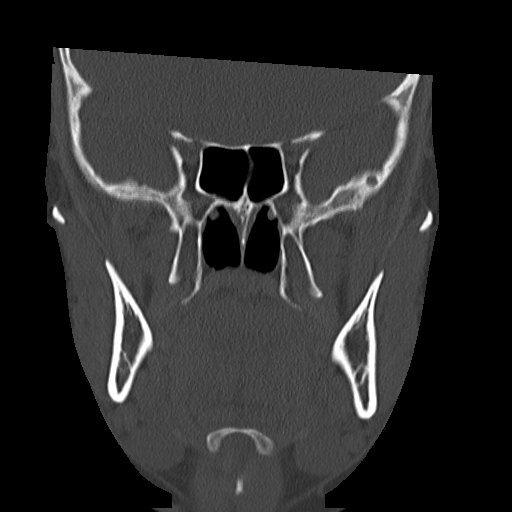
[im 59/79  bone]
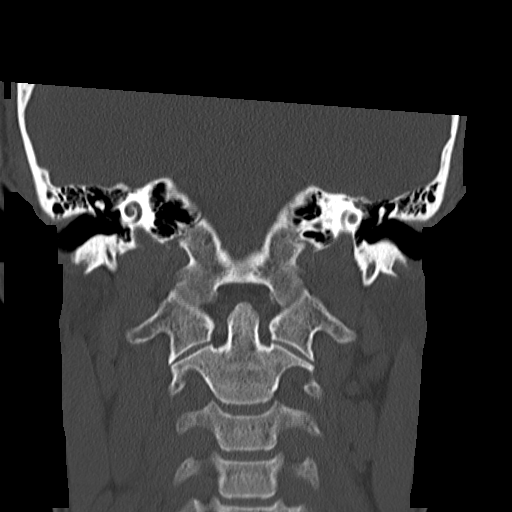

[Series 7: sagittal bone · sagittal · 0.31mm/px · 2 of 75 slices shown]
[im 25/75  bone]
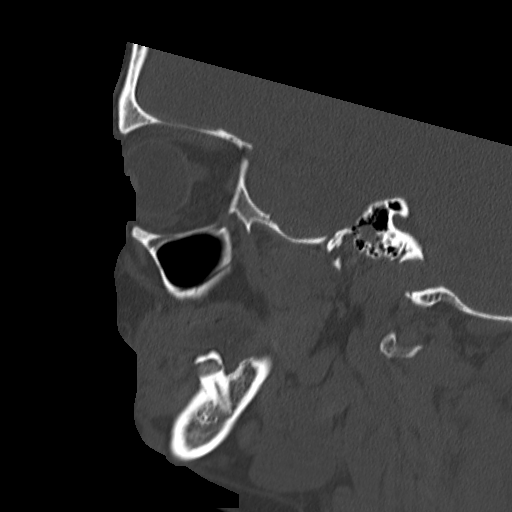
[im 50/75  bone]
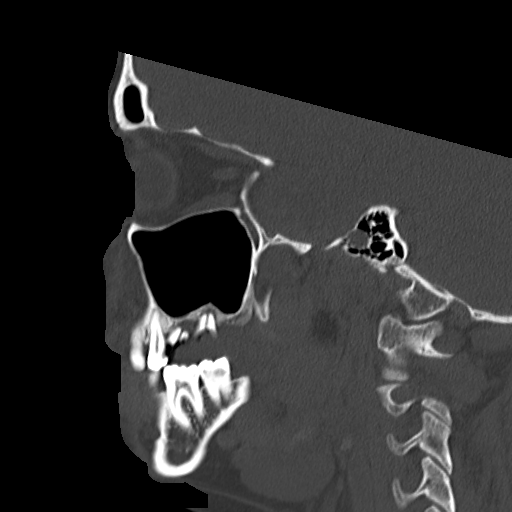

[16 of 47 positions shown; findings below may reference images not displayed]

FINDINGS: There is a displaced comminuted fracture of the nasal bone, shifted
to the right. There is adjacent soft tissue swelling. Orbits are
intact. Paranasal sinuses are intact.

The patient has numerous caries with periapical lucencies consistent
with periapical infection around numerous teeth in the maxilla.
There are multiple missing teeth.
IMPRESSION: Slightly displaced nasal bone fracture.

## 2015-08-18 IMAGING — CR DG HAND COMPLETE 3+V*L*
1 series · 3 of 3 positions shown · non-contrast
Comparison: None.

CLINICAL DATA: Fall, fifth digit pain

EXAM:
LEFT HAND - COMPLETE 3+ VIEW

[Series 1: x hand pa left · 0.14mm/px · 3 of 3 slices shown]
[im 1/3]
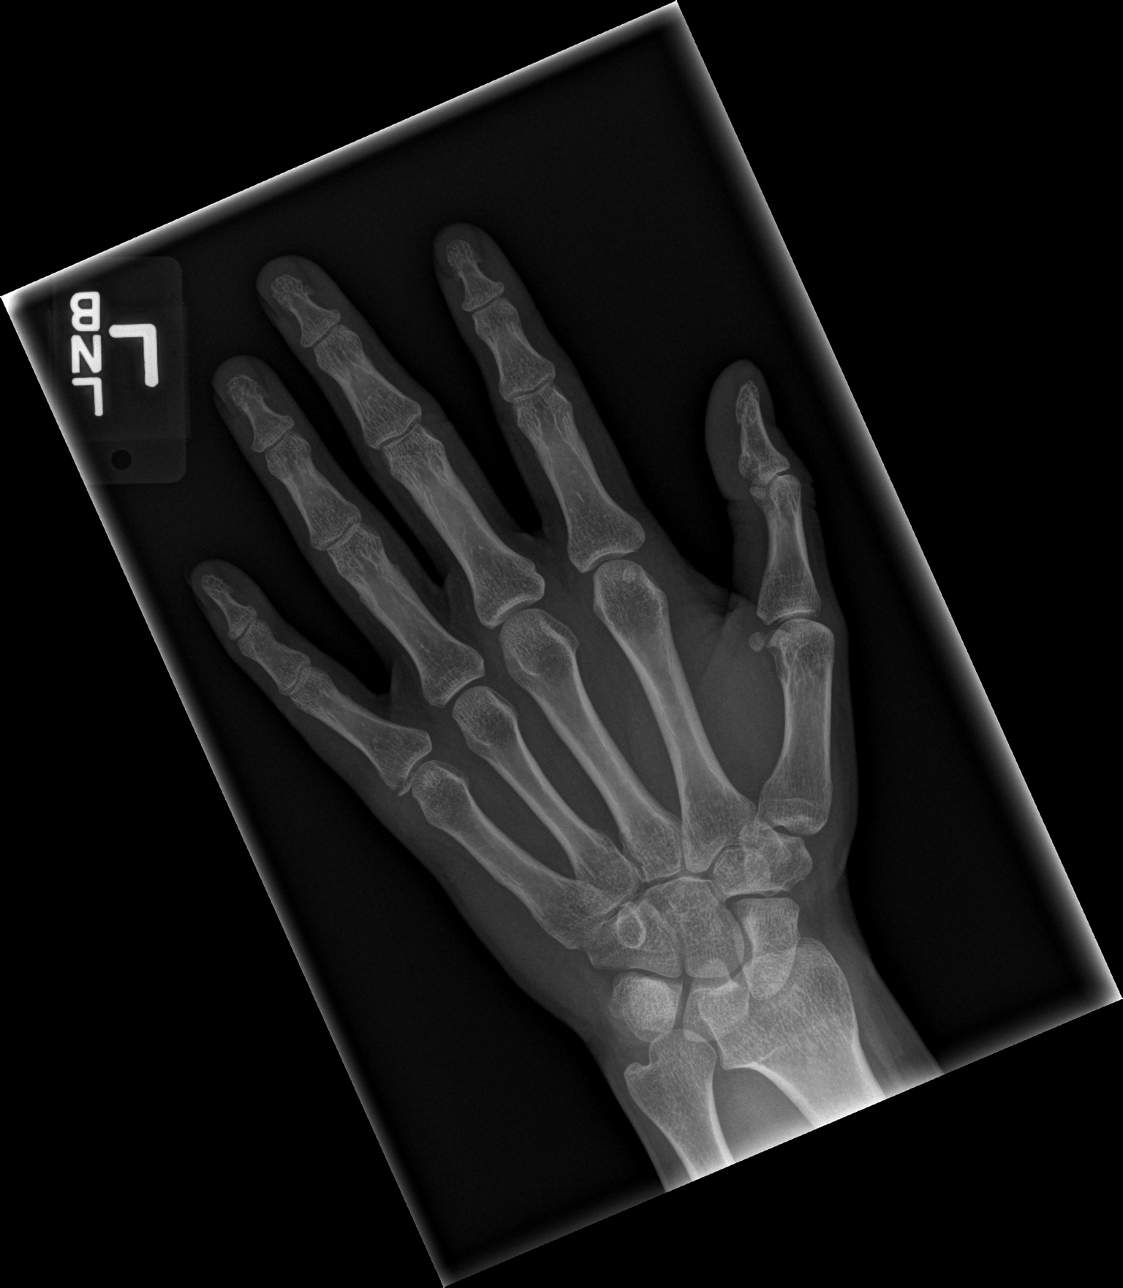
[im 2/3]
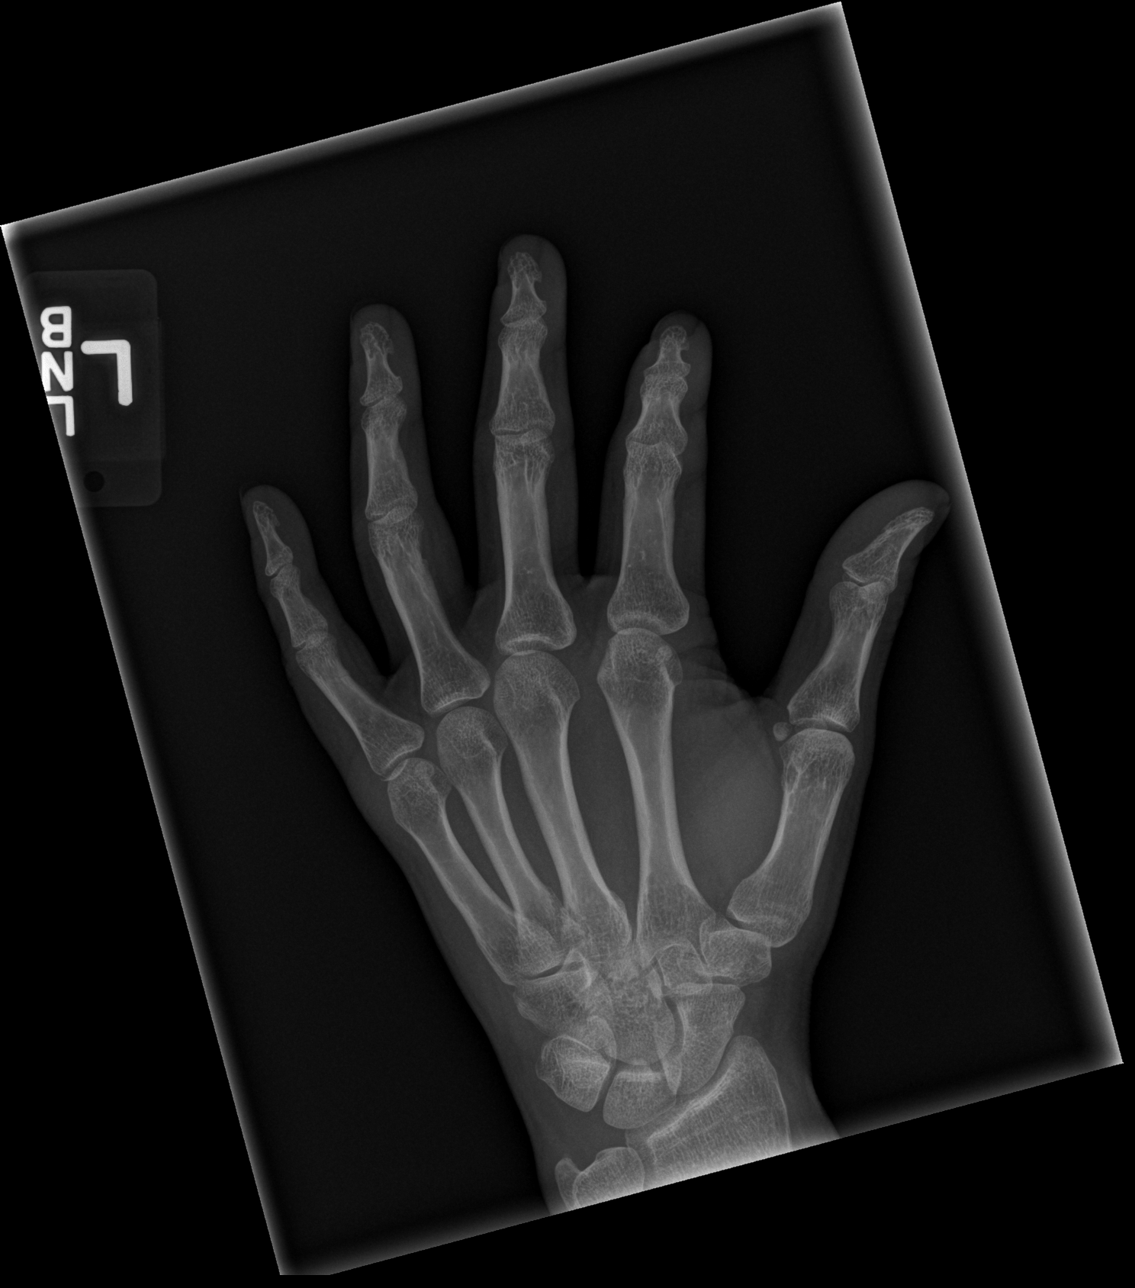
[im 3/3]
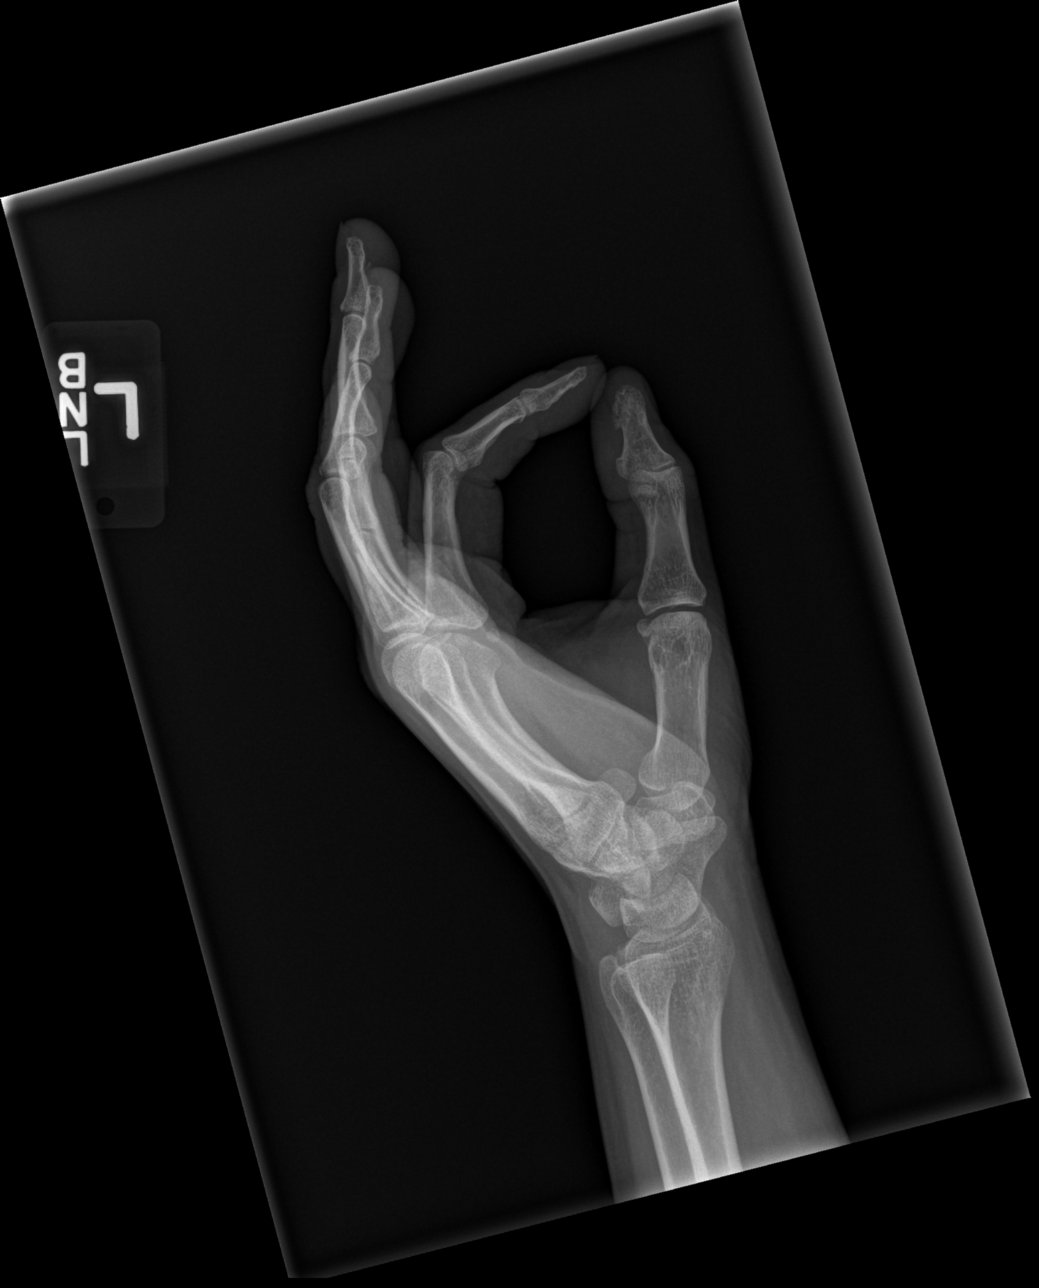

[3 of 3 positions shown; findings below may reference images not displayed]

FINDINGS: Three views of left hand submitted. There is nondisplaced fracture
at the base of proximal phalanx fifth finger.
IMPRESSION: Nondisplaced fracture at the base of proximal phalanx fifth finger.

## 2018-01-14 ENCOUNTER — Emergency Department (HOSPITAL_COMMUNITY): Payer: Medicaid - Out of State

## 2018-01-14 ENCOUNTER — Inpatient Hospital Stay (HOSPITAL_COMMUNITY)
Admission: EM | Admit: 2018-01-14 | Discharge: 2018-01-15 | DRG: 446 | Disposition: A | Discharge status: Home / Self Care | Payer: Medicaid - Out of State | Attending: Student | Admitting: Student

## 2018-01-14 ENCOUNTER — Encounter (HOSPITAL_COMMUNITY): Payer: Self-pay | Admitting: Emergency Medicine

## 2018-01-14 ENCOUNTER — Other Ambulatory Visit: Payer: Self-pay

## 2018-01-14 DIAGNOSIS — F410 Panic disorder [episodic paroxysmal anxiety] without agoraphobia: Secondary | ICD-10-CM | POA: Diagnosis present

## 2018-01-14 DIAGNOSIS — E876 Hypokalemia: Secondary | ICD-10-CM | POA: Diagnosis present

## 2018-01-14 DIAGNOSIS — Z79899 Other long term (current) drug therapy: Secondary | ICD-10-CM

## 2018-01-14 DIAGNOSIS — K810 Acute cholecystitis: Principal | ICD-10-CM | POA: Diagnosis present

## 2018-01-14 DIAGNOSIS — F419 Anxiety disorder, unspecified: Secondary | ICD-10-CM | POA: Diagnosis present

## 2018-01-14 DIAGNOSIS — K819 Cholecystitis, unspecified: Secondary | ICD-10-CM | POA: Diagnosis not present

## 2018-01-14 DIAGNOSIS — B1920 Unspecified viral hepatitis C without hepatic coma: Secondary | ICD-10-CM | POA: Diagnosis present

## 2018-01-14 DIAGNOSIS — F39 Unspecified mood [affective] disorder: Secondary | ICD-10-CM | POA: Diagnosis present

## 2018-01-14 DIAGNOSIS — F17210 Nicotine dependence, cigarettes, uncomplicated: Secondary | ICD-10-CM | POA: Diagnosis present

## 2018-01-14 LAB — COMPREHENSIVE METABOLIC PANEL
ALBUMIN: 3.6 g/dL (ref 3.5–5.0)
ALK PHOS: 95 U/L (ref 38–126)
ALT: 49 U/L — ABNORMAL HIGH (ref 0–44)
ANION GAP: 7 (ref 5–15)
AST: 45 U/L — ABNORMAL HIGH (ref 15–41)
BUN: 6 mg/dL (ref 6–20)
CALCIUM: 9.1 mg/dL (ref 8.9–10.3)
CHLORIDE: 108 mmol/L (ref 98–111)
CO2: 23 mmol/L (ref 22–32)
Creatinine, Ser: 0.79 mg/dL (ref 0.44–1.00)
GFR calc Af Amer: >60 mL/min (ref >60)
GFR calc non Af Amer: >60 mL/min (ref >60)
GLUCOSE: 99 mg/dL (ref 70–99)
POTASSIUM: 3.2 mmol/L — AB (ref 3.5–5.1)
SODIUM: 138 mmol/L (ref 135–145)
Total Bilirubin: 0.5 mg/dL (ref 0.3–1.2)
Total Protein: 7 g/dL (ref 6.5–8.1)

## 2018-01-14 LAB — URINALYSIS, ROUTINE W REFLEX MICROSCOPIC
BILIRUBIN URINE: NEGATIVE
GLUCOSE, UA: NEGATIVE mg/dL
Ketones, ur: NEGATIVE mg/dL
NITRITE: NEGATIVE
PH: 7 (ref 5.0–8.0)
Protein, ur: NEGATIVE mg/dL
SPECIFIC GRAVITY, URINE: 1.014 (ref 1.005–1.030)

## 2018-01-14 LAB — CBC WITH DIFFERENTIAL/PLATELET
ABS IMMATURE GRANULOCYTES: 0.02 10*3/uL (ref 0.00–0.07)
Basophils Absolute: 0.1 10*3/uL (ref 0.0–0.1)
Basophils Relative: 1 %
Eosinophils Absolute: 0.2 10*3/uL (ref 0.0–0.5)
Eosinophils Relative: 3 %
HEMATOCRIT: 40.5 % (ref 36.0–46.0)
Hemoglobin: 13.1 g/dL (ref 12.0–15.0)
IMMATURE GRANULOCYTES: 0 %
LYMPHS ABS: 2 10*3/uL (ref 0.7–4.0)
Lymphocytes Relative: 23 %
MCH: 30.2 pg (ref 26.0–34.0)
MCHC: 32.3 g/dL (ref 30.0–36.0)
MCV: 93.3 fL (ref 80.0–100.0)
MONOS PCT: 5 %
Monocytes Absolute: 0.5 10*3/uL (ref 0.1–1.0)
NEUTROS ABS: 5.9 10*3/uL (ref 1.7–7.7)
NEUTROS PCT: 68 %
Platelets: 329 10*3/uL (ref 150–400)
RBC: 4.34 MIL/uL (ref 3.87–5.11)
RDW: 13.2 % (ref 11.5–15.5)
WBC: 8.6 10*3/uL (ref 4.0–10.5)
nRBC: 0 % (ref 0.0–0.2)

## 2018-01-14 LAB — LIPASE, BLOOD: LIPASE: 30 U/L (ref 11–51)

## 2018-01-14 MED ORDER — ONDANSETRON HCL 4 MG PO TABS: 4.0000 mg | ORAL_TABLET | Freq: Four times a day (QID) | ORAL | Status: DC | PRN

## 2018-01-14 MED ORDER — POTASSIUM CHLORIDE IN NACL 20-0.9 MEQ/L-% IV SOLN
INTRAVENOUS | Status: AC
  Administered 2018-01-14: 23:00:00 via INTRAVENOUS

## 2018-01-14 MED ORDER — ALUM & MAG HYDROXIDE-SIMETH 200-200-20 MG/5ML PO SUSP
30.0000 mL | Freq: Once | ORAL | Status: AC
  Administered 2018-01-14: 30 mL via ORAL
  Filled 2018-01-14: qty 30

## 2018-01-14 MED ORDER — IOPAMIDOL (ISOVUE-300) INJECTION 61%
100.0000 mL | Freq: Once | INTRAVENOUS | Status: AC | PRN
  Administered 2018-01-14: 100 mL via INTRAVENOUS

## 2018-01-14 MED ORDER — ACETAMINOPHEN 650 MG RE SUPP: 650.0000 mg | Freq: Four times a day (QID) | RECTAL | Status: DC | PRN

## 2018-01-14 MED ORDER — SODIUM CHLORIDE 0.9 % IV SOLN
INTRAVENOUS | Status: DC
  Administered 2018-01-14: 19:00:00 via INTRAVENOUS

## 2018-01-14 MED ORDER — ACETAMINOPHEN 325 MG PO TABS: 650.0000 mg | ORAL_TABLET | Freq: Four times a day (QID) | ORAL | Status: DC | PRN

## 2018-01-14 MED ORDER — FENTANYL CITRATE (PF) 100 MCG/2ML IJ SOLN
25.0000 ug | INTRAMUSCULAR | Status: DC | PRN
  Administered 2018-01-14 – 2018-01-15 (×6): 50 ug via INTRAVENOUS
  Filled 2018-01-14 (×6): qty 2

## 2018-01-14 MED ORDER — BUSPIRONE HCL 5 MG PO TABS
10.0000 mg | ORAL_TABLET | Freq: Two times a day (BID) | ORAL | Status: DC
  Administered 2018-01-14 – 2018-01-15 (×2): 10 mg via ORAL
  Filled 2018-01-14 (×2): qty 2

## 2018-01-14 MED ORDER — ONDANSETRON HCL 4 MG/2ML IJ SOLN
4.0000 mg | Freq: Four times a day (QID) | INTRAMUSCULAR | Status: DC | PRN
  Administered 2018-01-15: 4 mg via INTRAVENOUS
  Filled 2018-01-14: qty 2

## 2018-01-14 MED ORDER — PIPERACILLIN-TAZOBACTAM 3.375 G IVPB
3.3750 g | Freq: Three times a day (TID) | INTRAVENOUS | Status: DC
  Administered 2018-01-15 (×2): 3.375 g via INTRAVENOUS
  Filled 2018-01-14 (×2): qty 50

## 2018-01-14 MED ORDER — QUETIAPINE FUMARATE 25 MG PO TABS
50.0000 mg | ORAL_TABLET | Freq: Every day | ORAL | Status: DC
  Administered 2018-01-14: 50 mg via ORAL
  Filled 2018-01-14: qty 2

## 2018-01-14 MED ORDER — PIPERACILLIN-TAZOBACTAM 3.375 G IVPB 30 MIN
3.3750 g | Freq: Once | INTRAVENOUS | Status: AC
  Administered 2018-01-14: 3.375 g via INTRAVENOUS
  Filled 2018-01-14: qty 50

## 2018-01-14 MED ORDER — MORPHINE SULFATE (PF) 4 MG/ML IV SOLN
4.0000 mg | Freq: Once | INTRAVENOUS | Status: AC
  Administered 2018-01-14: 4 mg via INTRAVENOUS
  Filled 2018-01-14: qty 1

## 2018-01-14 NOTE — ED Provider Notes (Signed)
[facility]ENCY [facility]PART[facility] Provider Note   CSN: [id number] Arrival date & time: 01/14/18  1757     History   Chief Complaint Chief Complaint  Patient presents with  . Abdominal Pain    HPI Kendra Ho is a 37 y.o. female.  HPI  The patient is a 37 year old female, she has a history of anxiety, panic attacks, she reports that she has had vaginal deliveries but never had a C-section and is never had any abdominal surgery.  She complains of having some epigastric and bilateral upper quadrant pain for the last 3 days, seem to get worse last night and is been present most of the day today.  She has not had any vomiting but feels nauseated secondary to the pain.  There is no change with her positioning, it does not seem to get worse during the day or night today however over the last couple of days it was mostly at night.  She does report that recently she was drinking excessively, she has been using frequent NSAIDs stating that ever since starting a new job where she is on her feet all day she is taking daily ibuprofen multiple times per day.  There is been no diarrhea dysuria frequency.  She does report some associated pain around her right shoulder which is new today as well.  Past Medical History:  Diagnosis Date  . Anxiety   . Panic attack     Patient Active Problem List   Diagnosis Date Noted  . Anxiety 01/14/2018  . Cholecystitis 01/14/2018    History reviewed. No pertinent surgical history.   OB History    Gravida  3   Para  3   Term  3   Preterm      AB      Living        SAB      TAB      Ectopic      Multiple      Live Births               Home Medications    Prior to Admission medications   Medication Sig Start Date End Date Taking? Authorizing Provider  acetaminophen (TYLENOL) 500 MG tablet Take 500-2,000 mg by mouth daily as needed. For pain   Yes [provider]  fluticasone (FLONASE) 50 MCG/ACT nasal spray  Place 2 sprays into both nostrils daily.   Yes [provider]  metroNIDAZOLE (FLAGYL) 500 MG tablet Take 500 mg by mouth 2 (two) times daily.   Yes [provider]  QUEtiapine (SEROQUEL) 50 MG tablet Take 1 tablet by mouth at bedtime. 10/30/17  Yes [provider]  triamcinolone ointment (KENALOG) 0.1 % Apply 1 application topically 2 (two) times daily as needed.   Yes [provider]  busPIRone (BUSPAR) 10 MG tablet Take 10 mg by mouth 2 (two) times daily. 12/30/17   [provider]    Family History No family history on file.  Social History Social History   Tobacco Use  . Smoking status: Current Every Day Smoker    Packs/day: 1.00    Types: Cigarettes  . Smokeless tobacco: Never Used  Substance Use Topics  . Alcohol use: Yes    Comment: 12 pack 1 week ago  . Drug use: No     Allergies   Risperdal [risperidone] and Bee venom   Review of Systems Review of Systems  All other systems reviewed and are negative.  Physical Exam Updated Vital Signs BP 130/87   Pulse 77   Temp 97.9 F (36.6 C) (Oral)   Resp 17   Ht 1.524 m (5')   Wt 81.6 kg   LMP 01/11/2018   SpO2 100%   BMI 35.15 kg/m   Physical Exam  Constitutional: She appears well-developed and well-nourished. No distress.  HENT:  Head: Normocephalic and atraumatic.  Mouth/Throat: Oropharynx is clear and moist. No oropharyngeal exudate.  Eyes: Pupils are equal, round, and reactive to light. Conjunctivae and EOM are normal. Right eye exhibits no discharge. Left eye exhibits no discharge. No scleral icterus.  Neck: Normal range of motion. Neck supple. No JVD present. No thyromegaly present.  Cardiovascular: Normal rate, regular rhythm, normal heart sounds and intact distal pulses. Exam reveals no gallop and no friction rub.  No murmur heard. Pulmonary/Chest: Effort normal and breath sounds normal. No respiratory distress. She has no wheezes. She has no rales.    Abdominal: Soft. Bowel sounds are normal. She exhibits no distension and no mass. There is tenderness ( Minimal right and left upper quadrant tenderness without any guarding, no Murphy sign).  There is no lower abdominal tenderness, the entire abdomen is very soft, mild epigastric tenderness  Musculoskeletal: Normal range of motion. She exhibits no edema or tenderness.  Lymphadenopathy:    She has no cervical adenopathy.  Neurological: She is alert. Coordination normal.  Skin: Skin is warm and dry. No rash noted. No erythema.  Psychiatric: She has a normal mood and affect. Her behavior is normal.  Nursing note and vitals reviewed.    ED Treatments / Results  Labs (all labs ordered are listed, but only abnormal results are displayed) Labs Reviewed  COMPREHENSIVE METABOLIC PANEL - Abnormal; Notable for the following components:      Result Value   Potassium 3.2 (*)    AST 45 (*)    ALT 49 (*)    All other components within normal limits  URINALYSIS, ROUTINE W REFLEX MICROSCOPIC - Abnormal; Notable for the following components:   APPearance CLOUDY (*)    Hgb urine dipstick SMALL (*)    Leukocytes, UA TRACE (*)    Bacteria, UA RARE (*)    All other components within normal limits  LIPASE, BLOOD  CBC WITH DIFFERENTIAL/PLATELET    EKG None  Radiology Ct Abdomen Pelvis W Contrast  Result Date: 01/14/2018 CLINICAL DATA:  Epigastric abdominal pain for 2 nights. Leukocytosis. Fever. Normal lipase. EXAM: CT ABDOMEN AND PELVIS WITH CONTRAST TECHNIQUE: Multidetector CT imaging of the abdomen and pelvis was performed using the standard protocol following bolus administration of intravenous contrast. CONTRAST:  ISOVUE-300 IOPAMIDOL (ISOVUE-300) INJECTION 61% COMPARISON:  10/02/2015 CT abdomen/pelvis. FINDINGS: Lower chest: No significant pulmonary nodules or acute consolidative airspace disease. Hepatobiliary: Normal liver size. No liver mass. Mild diffuse gallbladder wall thickening  and gallbladder wall mucosal hyperenhancement with pericholecystic fat stranding. No radiopaque cholelithiasis. No biliary ductal dilatation. CBD diameter 5 mm. Pancreas: Normal, with no mass or duct dilation. Spleen: Normal size. No mass. Adrenals/Urinary Tract: Normal adrenals. Nonobstructing 2 mm lower right renal stone. Punctate nonobstructing 1 mm lower left renal stones. No hydronephrosis. Subcentimeter hypodense renal cortical lesions in the upper right and lower left kidney are too small to characterize and require no follow-up. Normal bladder. Stomach/Bowel: Normal non-distended stomach. Normal caliber small bowel with no small bowel wall thickening. Normal appendix. Normal large bowel with no diverticulosis, large bowel wall thickening or pericolonic fat stranding. Vascular/Lymphatic: Normal caliber abdominal  aorta. Patent portal, splenic, hepatic and renal veins. No pathologically enlarged lymph nodes in the abdomen or pelvis. Reproductive: Grossly normal uterus.  No adnexal mass. Other: No pneumoperitoneum, ascites or focal fluid collection. Musculoskeletal: No aggressive appearing focal osseous lesions. IMPRESSION: 1. Nonspecific mild diffuse gallbladder wall thickening and mucosal hyperenhancement with pericholecystic fat stranding. No radiopaque cholelithiasis. If there is clinical concern for acute cholecystitis, right upper quadrant abdominal ultrasound correlation is suggested. 2. No biliary ductal dilatation. 3. No evidence of bowel obstruction or acute bowel inflammation. Normal appendix. 4. Nonobstructing punctate bilateral nephrolithiasis. Electronically Signed   By: Delbert Phenix M.D.   On: 01/14/2018 20:35    Procedures Procedures (including critical care time)  Medications Ordered in ED Medications  0.9 %  sodium chloride infusion ( Intravenous New Bag/Given 01/14/18 1848)  piperacillin-tazobactam (ZOSYN) IVPB 3.375 g (has no administration in time range)  alum & mag  hydroxide-simeth (MAALOX/MYLANTA) 200-200-20 MG/5ML suspension 30 mL (30 mLs Oral Given 01/14/18 1839)  morphine 4 MG/ML injection 4 mg (4 mg Intravenous Given 01/14/18 1848)  iopamidol (ISOVUE-300) 61 % injection 100 mL (100 mLs Intravenous Contrast Given 01/14/18 2009)     Initial Impression / Assessment and Plan / ED Course  I have reviewed the triage vital signs and the nursing notes.  Pertinent labs & imaging results that were available during my care of the patient were reviewed by me and considered in my medical decision making (see chart for details).  Clinical Course as of Jan 14 2110  Thu Jan 14, 2018  1610 The patient does report that she has a history of hepatitis C, diagnosed 1 year ago, not currently on treatment.   [BM]  1952 CBC without any leukocytosis, potassium 3.2, LFTs minimally elevated in the 40s.  Urinalysis with 21-50 squamous, rare bacteria, 0-5 white blood cells.  Doubt pyelonephritis or urinary tract infection or kidney stone given these findings.   [BM]  2102 CT scan shows diffuse gallbladder wall thickening, no obvious stones, the patient is now more tender in the right upper quadrant than she was previously, despite lack of abnormal blood work the patient likely has either biliary colic or cholecystitis and will need to be admitted to the hospital.  I discussed the care with Dr. Franky Macho who has been amenable to see the patient in the morning and request hospitalist admission with antibiotics and n.p.o. after midnight.  Hospitalist paged   [BM]    Clinical Course User Index [BM] Eber Hong, MD    The patient's symptoms seem to be related more to gastrointestinal cause given that she has had increased amounts of ibuprofen frequently as well as increased amounts of alcohol I suggest that this is probably related to either pancreatitis gallstone disease gallbladder disease or peptic ulcer disease.  She is not jaundiced in the eyes or tachycardic or  ill-appearing.  She has no Murphy sign.  Will obtain labs and a CT scan as she is never had anything like this in the past.  The patient is agreeable.  D/w Dr. Antionette Char with hospitalist who will admit  Final Clinical Impressions(s) / ED Diagnoses   Final diagnoses:  Acute cholecystitis      Eber Hong, MD 01/14/18 2112

## 2018-01-14 NOTE — ED Triage Notes (Signed)
Patient reports epigastric pain for the past couple of days that lasts for hours then resolves. Patient states today "she just feels bruised and sore." Emesis due to pain, no diarrhea. No fever. No hematuria or melena.

## 2018-01-14 NOTE — Progress Notes (Signed)
Patient admitted to [facility] room 327 with the diagnosis of cholecystitis. Alert and oriented x 4. Patient oriented to her call bell/ascom and staff. Full assessment to epic completed. Will continue to monitor.

## 2018-01-14 NOTE — ED Notes (Signed)
ED TO INPATIENT HANDOFF REPORT  Name/Age/Gender Kendra Ho 37 y.o. female  Code Status    Code Status Orders  (From admission, onward)         Start     Ordered   01/14/18 2114  Full code  Continuous     01/14/18 2115        Code Status History    This patient has a current code status but no historical code status.      Home/SNF/Other Home  Chief Complaint abd pain  Level of Care/Admitting Diagnosis ED Disposition    ED Disposition Condition Comment   Admit  Hospital Area: [facility] [[id number]]  Level of Care: Med-Surg [16]  Diagnosis: Cholecystitis [161096]  Admitting Physician: Briscoe Deutscher [[id number]]  Attending Physician: Briscoe Deutscher [[id number]]  PT Class (Do Not Modify): Observation [104]  PT Acc Code (Do Not Modify): Observation [10022]       Medical History Past Medical History:  Diagnosis Date  . Anxiety   . Panic attack     Allergies Allergies  Allergen Reactions  . Risperdal [Risperidone] Anaphylaxis  . Bee Venom     IV Location/Drains/Wounds Patient Lines/Drains/Airways Status   Active Line/Drains/Airways    Name:   Placement date:   Placement time:   Site:   Days:   Peripheral IV 01/14/18 Right Antecubital   01/14/18    1845    Antecubital   less than 1          Labs/Imaging Results for orders placed or performed during the hospital encounter of 01/14/18 (from the past 48 hour(s))  Urinalysis, Routine w reflex microscopic     Status: Abnormal   Collection Time: 01/14/18  6:35 PM  Result Value Ref Range   Color, Urine YELLOW YELLOW   APPearance CLOUDY (A) CLEAR   Specific Gravity, Urine 1.014 1.005 - 1.030   pH 7.0 5.0 - 8.0   Glucose, UA NEGATIVE NEGATIVE mg/dL   Hgb urine dipstick SMALL (A) NEGATIVE   Bilirubin Urine NEGATIVE NEGATIVE   Ketones, ur NEGATIVE NEGATIVE mg/dL   Protein, ur NEGATIVE NEGATIVE mg/dL   Nitrite NEGATIVE NEGATIVE   Leukocytes, UA TRACE (A) NEGATIVE   RBC / HPF 0-5 0 - 5  RBC/hpf   WBC, UA 0-5 0 - 5 WBC/hpf   Bacteria, UA RARE (A) NONE SEEN   Squamous Epithelial / LPF 21-50 0 - 5   Mucus PRESENT    Hyaline Casts, UA PRESENT     Comment: Performed at [facility], [address], [place], [place] [address]  Lipase, blood     Status: None   Collection Time: 01/14/18  6:45 PM  Result Value Ref Range   Lipase 30 11 - 51 U/L    Comment: Performed at [facility], [address] [place], [place] [address]  Comprehensive metabolic panel     Status: Abnormal   Collection Time: 01/14/18  6:45 PM  Result Value Ref Range   Sodium 138 135 - 145 mmol/L   Potassium 3.2 (L) 3.5 - 5.1 mmol/L   Chloride 108 98 - 111 mmol/L   CO2 23 22 - 32 mmol/L   Glucose, Bld 99 70 - 99 mg/dL   BUN 6 6 - 20 mg/dL   Creatinine, Ser 5.78 0.44 - 1.00 mg/dL   Calcium 9.1 8.9 - 46.9 mg/dL   Total Protein 7.0 6.5 - 8.1 g/dL   Albumin 3.6 3.5 - 5.0 g/dL   AST 45 (H)  15 - 41 U/L   ALT 49 (H) 0 - 44 U/L   Alkaline Phosphatase 95 38 - 126 U/L   Total Bilirubin 0.5 0.3 - 1.2 mg/dL   GFR calc non Af Amer >60 >60 mL/min   GFR calc Af Amer >60 >60 mL/min   Anion gap 7 5 - 15    Comment: Performed at [facility], [address] [place], [place] [address]  CBC with Differential/Platelet     Status: None   Collection Time: 01/14/18  6:45 PM  Result Value Ref Range   WBC 8.6 4.0 - 10.5 K/uL   RBC 4.34 3.87 - 5.11 MIL/uL   Hemoglobin 13.1 12.0 - 15.0 g/dL   HCT 62.1 30.8 - 65.7 %   MCV 93.3 80.0 - 100.0 fL   MCH 30.2 26.0 - 34.0 pg   MCHC 32.3 30.0 - 36.0 g/dL   RDW 84.6 96.2 - 95.2 %   Platelets 329 150 - 400 K/uL   nRBC 0.0 0.0 - 0.2 %   Neutrophils Relative % 68 %   Neutro Abs 5.9 1.7 - 7.7 K/uL   Lymphocytes Relative 23 %   Lymphs Abs 2.0 0.7 - 4.0 K/uL   Monocytes Relative 5 %   Monocytes Absolute 0.5 0.1 - 1.0 K/uL   Eosinophils Relative 3 %   Eosinophils Absolute 0.2 0.0 - 0.5 K/uL   Basophils Relative 1 %   Basophils Absolute 0.1 0.0 - 0.1 K/uL   Immature Granulocytes 0  %   Abs Immature Granulocytes 0.02 0.00 - 0.07 K/uL    Comment: Performed at [facility], [address] [place], [place] [address]   Ct Abdomen Pelvis W Contrast  Result Date: 01/14/2018 CLINICAL DATA:  Epigastric abdominal pain for 2 nights. Leukocytosis. Fever. Normal lipase. EXAM: CT ABDOMEN AND PELVIS WITH CONTRAST TECHNIQUE: Multidetector CT imaging of the abdomen and pelvis was performed using the standard protocol following bolus administration of intravenous contrast. CONTRAST:  ISOVUE-300 IOPAMIDOL (ISOVUE-300) INJECTION 61% COMPARISON:  10/02/2015 CT abdomen/pelvis. FINDINGS: Lower chest: No significant pulmonary nodules or acute consolidative airspace disease. Hepatobiliary: Normal liver size. No liver mass. Mild diffuse gallbladder wall thickening and gallbladder wall mucosal hyperenhancement with pericholecystic fat stranding. No radiopaque cholelithiasis. No biliary ductal dilatation. CBD diameter 5 mm. Pancreas: Normal, with no mass or duct dilation. Spleen: Normal size. No mass. Adrenals/Urinary Tract: Normal adrenals. Nonobstructing 2 mm lower right renal stone. Punctate nonobstructing 1 mm lower left renal stones. No hydronephrosis. Subcentimeter hypodense renal cortical lesions in the upper right and lower left kidney are too small to characterize and require no follow-up. Normal bladder. Stomach/Bowel: Normal non-distended stomach. Normal caliber small bowel with no small bowel wall thickening. Normal appendix. Normal large bowel with no diverticulosis, large bowel wall thickening or pericolonic fat stranding. Vascular/Lymphatic: Normal caliber abdominal aorta. Patent portal, splenic, hepatic and renal veins. No pathologically enlarged lymph nodes in the abdomen or pelvis. Reproductive: Grossly normal uterus.  No adnexal mass. Other: No pneumoperitoneum, ascites or focal fluid collection. Musculoskeletal: No aggressive appearing focal osseous lesions. IMPRESSION: 1. Nonspecific  mild diffuse gallbladder wall thickening and mucosal hyperenhancement with pericholecystic fat stranding. No radiopaque cholelithiasis. If there is clinical concern for acute cholecystitis, right upper quadrant abdominal ultrasound correlation is suggested. 2. No biliary ductal dilatation. 3. No evidence of bowel obstruction or acute bowel inflammation. Normal appendix. 4. Nonobstructing punctate bilateral nephrolithiasis. Electronically Signed   By: Delbert Phenix M.D.   On: 01/14/2018 20:35  Pending Labs Unresulted Labs (From admission, onward)    Start     Ordered   01/15/18 0500  HIV antibody (Routine Testing)  Tomorrow morning,   R     01/14/18 2115   01/15/18 0500  Comprehensive metabolic panel  Tomorrow morning,   R     01/14/18 2115   01/15/18 0500  CBC WITH DIFFERENTIAL  Tomorrow morning,   R     01/14/18 2115   01/15/18 0500  Magnesium  Tomorrow morning,   R     01/14/18 2125          Vitals/Pain Today's Vitals   01/14/18 1830 01/14/18 1900 01/14/18 1931 01/14/18 2100  BP:  136/83  130/87  Pulse:  84  77  Resp:  17  17  Temp:      TempSrc:      SpO2:  98%  100%  Weight:      Height:      PainSc: 8   7      Isolation Precautions No active isolations  Medications Medications  piperacillin-tazobactam (ZOSYN) IVPB 3.375 g (3.375 g Intravenous Transfusing/Transfer 01/14/18 2140)  busPIRone (BUSPAR) tablet 10 mg (has no administration in time range)  QUEtiapine (SEROQUEL) tablet 50 mg (has no administration in time range)  0.9 % NaCl with KCl 20 mEq/ L  infusion ( Intravenous Hold 01/14/18 2124)  acetaminophen (TYLENOL) tablet 650 mg (has no administration in time range)    Or  acetaminophen (TYLENOL) suppository 650 mg (has no administration in time range)  ondansetron (ZOFRAN) tablet 4 mg (has no administration in time range)    Or  ondansetron (ZOFRAN) injection 4 mg (has no administration in time range)  fentaNYL (SUBLIMAZE) injection 25-50 mcg (has no  administration in time range)  piperacillin-tazobactam (ZOSYN) IVPB 3.375 g (3.375 g Intravenous Transfusing/Transfer 01/14/18 2140)  alum & mag hydroxide-simeth (MAALOX/MYLANTA) 200-200-20 MG/5ML suspension 30 mL (30 mLs Oral Given 01/14/18 1839)  morphine 4 MG/ML injection 4 mg (4 mg Intravenous Given 01/14/18 1848)  iopamidol (ISOVUE-300) 61 % injection 100 mL (100 mLs Intravenous Contrast Given 01/14/18 2009)    Mobility walks

## 2018-01-14 NOTE — H&P (Signed)
History and Physical    Kendra Ho:[id number] DOB: April 24, 1980 DOA: 01/14/2018  PCP: Patient, No Pcp Per   Patient coming from: Home   Chief Complaint: Abdominal pain, nausea   HPI: Kendra Ho is a 37 y.o. female with medical history significant for anxiety and hepatitis C, now presenting to the emergency department for evaluation of upper abdominal pain with nausea.  Patient reports that she was recently treated for bacterial vaginosis, but had otherwise been well until approximately 3 days ago when she noted some upper abdominal pain with nausea, worse after eating.  She continued to experience this and symptoms and became more severe and constant over the past day.  She describes severe, sharp, localized pain to the upper abdomen with nausea but no vomiting.  She reports chills.  Denies any history of similar pain prior to this episode.  Denies diarrhea, melena, or hematochezia.  ED Course: Upon arrival to the ED, patient is found to be afebrile, saturating well on room air, and with vitals otherwise stable.  Chemistry panel is notable for mild hypokalemia and CBC is unremarkable.  CT of the abdomen and pelvis reveals mild diffuse gallbladder wall thickening and mucosal hyperenhancement with pericholecystic fluid.  Patient was given IV fluids, morphine, and Zosyn in the ED.  Surgery was consulted by the ED physician and recommended medical admission with patient to be n.p.o. after midnight and kept on antibiotics.  Review of Systems:  All other systems reviewed and apart from HPI, are negative.  Past Medical History:  Diagnosis Date  . Anxiety   . Panic attack     History reviewed. No pertinent surgical history.   reports that she has been smoking cigarettes. She has been smoking about 1.00 pack per day. She has never used smokeless tobacco. She reports that she drinks alcohol. She reports that she does not use drugs.  Allergies  Allergen Reactions  . Risperdal  [Risperidone] Anaphylaxis  . Bee Venom     Family History  Problem Relation Age of Onset  . Obesity Mother      Prior to Admission medications   Medication Sig Start Date End Date Taking? Authorizing Provider  acetaminophen (TYLENOL) 500 MG tablet Take 500-2,000 mg by mouth daily as needed. For pain   Yes [provider]  fluticasone (FLONASE) 50 MCG/ACT nasal spray Place 2 sprays into both nostrils daily.   Yes [provider]  metroNIDAZOLE (FLAGYL) 500 MG tablet Take 500 mg by mouth 2 (two) times daily.   Yes [provider]  QUEtiapine (SEROQUEL) 50 MG tablet Take 1 tablet by mouth at bedtime. 10/30/17  Yes [provider]  triamcinolone ointment (KENALOG) 0.1 % Apply 1 application topically 2 (two) times daily as needed.   Yes [provider]  busPIRone (BUSPAR) 10 MG tablet Take 10 mg by mouth 2 (two) times daily. 12/30/17   [provider]    Physical Exam: Vitals:   01/14/18 1802 01/14/18 1900 01/14/18 2100  BP: (!) 150/74 136/83 130/87  Pulse: 93 84 77  Resp: 17 17 17   Temp: 97.9 F (36.6 C)    TempSrc: Oral    SpO2: 100% 98% 100%  Weight: 81.6 kg    Height: 5' (1.524 m)      Constitutional: NAD, calm Eyes: PERTLA, lids and conjunctivae normal ENMT: Mucous membranes are moist. Posterior pharynx clear of any exudate or lesions.   Neck: normal, supple, no masses, no thyromegaly Respiratory: clear to auscultation bilaterally, no  wheezing, no crackles. Normal respiratory effort.   Cardiovascular: S1 & S2 heard, regular rate and rhythm. No extremity edema.   Abdomen: No distension, soft, tender in epigastrium and RUQ. Bowel sounds active.  Musculoskeletal: no clubbing / cyanosis. No joint deformity upper and lower extremities.    Skin: no significant rashes, lesions, ulcers. Warm, dry, well-perfused. Neurologic: No facial asymmetry. Sensation intact. Moving all extremities.  Psychiatric:  Alert and oriented x 3.  Calm, cooperative.    Labs on Admission: I have personally reviewed following labs and imaging studies  CBC: Recent Labs  Lab 01/14/18 1845  WBC 8.6  NEUTROABS 5.9  HGB 13.1  HCT 40.5  MCV 93.3  PLT 329   Basic Metabolic Panel: Recent Labs  Lab 01/14/18 1845  NA 138  K 3.2*  CL 108  CO2 23  GLUCOSE 99  BUN 6  CREATININE 0.79  CALCIUM 9.1   GFR: Estimated Creatinine Clearance: 92.8 mL/min (by C-G formula based on SCr of 0.79 mg/dL). Liver Function Tests: Recent Labs  Lab 01/14/18 1845  AST 45*  ALT 49*  ALKPHOS 95  BILITOT 0.5  PROT 7.0  ALBUMIN 3.6   Recent Labs  Lab 01/14/18 1845  LIPASE 30   No results for input(s): AMMONIA in the last 168 hours. Coagulation Profile: No results for input(s): INR, PROTIME in the last 168 hours. Cardiac Enzymes: No results for input(s): CKTOTAL, CKMB, CKMBINDEX, TROPONINI in the last 168 hours. BNP (last 3 results) No results for input(s): PROBNP in the last 8760 hours. HbA1C: No results for input(s): HGBA1C in the last 72 hours. CBG: No results for input(s): GLUCAP in the last 168 hours. Lipid Profile: No results for input(s): CHOL, HDL, LDLCALC, TRIG, CHOLHDL, LDLDIRECT in the last 72 hours. Thyroid Function Tests: No results for input(s): TSH, T4TOTAL, FREET4, T3FREE, THYROIDAB in the last 72 hours. Anemia Panel: No results for input(s): VITAMINB12, FOLATE, FERRITIN, TIBC, IRON, RETICCTPCT in the last 72 hours. Urine analysis:    Component Value Date/Time   COLORURINE YELLOW 01/14/2018 1835   APPEARANCEUR CLOUDY (A) 01/14/2018 1835   LABSPEC 1.014 01/14/2018 1835   PHURINE 7.0 01/14/2018 1835   GLUCOSEU NEGATIVE 01/14/2018 1835   HGBUR SMALL (A) 01/14/2018 1835   BILIRUBINUR NEGATIVE 01/14/2018 1835   KETONESUR NEGATIVE 01/14/2018 1835   PROTEINUR NEGATIVE 01/14/2018 1835   NITRITE NEGATIVE 01/14/2018 1835   LEUKOCYTESUR TRACE (A) 01/14/2018 1835   Sepsis  Labs: @LABRCNTIP (procalcitonin:4,lacticidven:4) )No results found for this or any previous visit (from the past 240 hour(s)).   Radiological Exams on Admission: Ct Abdomen Pelvis W Contrast  Result Date: 01/14/2018 CLINICAL DATA:  Epigastric abdominal pain for 2 nights. Leukocytosis. Fever. Normal lipase. EXAM: CT ABDOMEN AND PELVIS WITH CONTRAST TECHNIQUE: Multidetector CT imaging of the abdomen and pelvis was performed using the standard protocol following bolus administration of intravenous contrast. CONTRAST:  ISOVUE-300 IOPAMIDOL (ISOVUE-300) INJECTION 61% COMPARISON:  10/02/2015 CT abdomen/pelvis. FINDINGS: Lower chest: No significant pulmonary nodules or acute consolidative airspace disease. Hepatobiliary: Normal liver size. No liver mass. Mild diffuse gallbladder wall thickening and gallbladder wall mucosal hyperenhancement with pericholecystic fat stranding. No radiopaque cholelithiasis. No biliary ductal dilatation. CBD diameter 5 mm. Pancreas: Normal, with no mass or duct dilation. Spleen: Normal size. No mass. Adrenals/Urinary Tract: Normal adrenals. Nonobstructing 2 mm lower right renal stone. Punctate nonobstructing 1 mm lower left renal stones. No hydronephrosis. Subcentimeter hypodense renal cortical lesions in the upper right and lower left kidney are too small to characterize and  require no follow-up. Normal bladder. Stomach/Bowel: Normal non-distended stomach. Normal caliber small bowel with no small bowel wall thickening. Normal appendix. Normal large bowel with no diverticulosis, large bowel wall thickening or pericolonic fat stranding. Vascular/Lymphatic: Normal caliber abdominal aorta. Patent portal, splenic, hepatic and renal veins. No pathologically enlarged lymph nodes in the abdomen or pelvis. Reproductive: Grossly normal uterus.  No adnexal mass. Other: No pneumoperitoneum, ascites or focal fluid collection. Musculoskeletal: No aggressive appearing focal osseous lesions.  IMPRESSION: 1. Nonspecific mild diffuse gallbladder wall thickening and mucosal hyperenhancement with pericholecystic fat stranding. No radiopaque cholelithiasis. If there is clinical concern for acute cholecystitis, right upper quadrant abdominal ultrasound correlation is suggested. 2. No biliary ductal dilatation. 3. No evidence of bowel obstruction or acute bowel inflammation. Normal appendix. 4. Nonobstructing punctate bilateral nephrolithiasis. Electronically Signed   By: Delbert PhenixJason A Poff M.D.   On: 01/14/2018 20:35    EKG: Not performed.   Assessment/Plan   1. Cholecystitis  - Presents with a couple days of postprandial upper abdominal pain and nausea, becoming more severe and constant over the past day  - CT suggests cholecystitis and she is tender in RUQ  - No fever or leukocytosis here but reports recent chills  - Slight transaminase elevation noted with normal bili and no ductal dilation on CT, possibly related to hep C  - Surgery consulted by ED physician and recommends medical admission, NPO after midnight, and antibiotics  - Continue bowel-rest, pain-control, and antibiotics   2. Anxiety  - Continue Buspar, Seroquel    3. Hypokalemia - Serum potassium is 3.2 on admission   - KCl added to IVF, repeat chem panel in am     DVT prophylaxis: SCD's  Code Status: Full  Family Communication: Discussed with patient  Consults called: Surgery Admission status: Observation     Briscoe Deutscherimothy S Roper Tolson, MD [facility] Pager [phone]  If 7PM-7AM, please contact night-coverage [email or web address] Password [email or web address]  01/14/2018, 9:24 PM

## 2018-01-14 NOTE — ED Notes (Signed)
Patient transported to CT 

## 2018-01-14 NOTE — Progress Notes (Signed)
Pharmacy Antibiotic Note  Khylei L Ciampi is aKennon Portela 37 y.o. female admitted on 01/14/2018 with Intra-abdominal Infection.Marland Kitchen.  Pharmacy has been consulted for  Zosyn dosing.  Plan: Start Zosyn 3.375g IV q8h (over 4 hrs) Pharmacy will continue to monitor patient progress, labs and cultures.  Height: 5' (152.4 cm) Weight: 180 lb (81.6 kg) IBW/kg (Calculated) : 45.5  Temp (24hrs), Avg:97.9 F (36.6 C), Min:97.9 F (36.6 C), Max:97.9 F (36.6 C)  Recent Labs  Lab 01/14/18 1845  WBC 8.6  CREATININE 0.79    Estimated Creatinine Clearance: 92.8 mL/min (by C-G formula based on SCr of 0.79 mg/dL).    Allergies  Allergen Reactions  . Risperdal [Risperidone] Anaphylaxis  . Bee Venom     Antimicrobials this admission: 11/28 Zosyn >>     Microbiology results: nothing ordered at this time    Thank you for allowing pharmacy to be a part of this patient's care.  Tama Highamara Drake Wuertz 01/14/2018 9:22 PM

## 2018-01-15 ENCOUNTER — Observation Stay (HOSPITAL_COMMUNITY): Payer: Medicaid - Out of State

## 2018-01-15 ENCOUNTER — Other Ambulatory Visit: Payer: Self-pay | Admitting: General Surgery

## 2018-01-15 DIAGNOSIS — K810 Acute cholecystitis: Secondary | ICD-10-CM

## 2018-01-15 DIAGNOSIS — E876 Hypokalemia: Secondary | ICD-10-CM

## 2018-01-15 DIAGNOSIS — R748 Abnormal levels of other serum enzymes: Secondary | ICD-10-CM | POA: Diagnosis not present

## 2018-01-15 LAB — CBC WITH DIFFERENTIAL/PLATELET
Abs Immature Granulocytes: 0.01 10*3/uL (ref 0.00–0.07)
Basophils Absolute: 0.1 10*3/uL (ref 0.0–0.1)
Basophils Relative: 1 %
Eosinophils Absolute: 0.2 10*3/uL (ref 0.0–0.5)
Eosinophils Relative: 4 %
HCT: 39.8 % (ref 36.0–46.0)
Hemoglobin: 12.4 g/dL (ref 12.0–15.0)
Immature Granulocytes: 0 %
Lymphocytes Relative: 31 %
Lymphs Abs: 1.9 10*3/uL (ref 0.7–4.0)
MCH: 30.2 pg (ref 26.0–34.0)
MCHC: 31.2 g/dL (ref 30.0–36.0)
MCV: 97.1 fL (ref 80.0–100.0)
MONO ABS: 0.3 10*3/uL (ref 0.1–1.0)
Monocytes Relative: 5 %
Neutro Abs: 3.6 10*3/uL (ref 1.7–7.7)
Neutrophils Relative %: 59 %
Platelets: 214 10*3/uL (ref 150–400)
RBC: 4.1 MIL/uL (ref 3.87–5.11)
RDW: 13.3 % (ref 11.5–15.5)
WBC: 6.1 10*3/uL (ref 4.0–10.5)
nRBC: 0 % (ref 0.0–0.2)

## 2018-01-15 LAB — COMPREHENSIVE METABOLIC PANEL
ALBUMIN: 3.2 g/dL — AB (ref 3.5–5.0)
ALT: 80 U/L — ABNORMAL HIGH (ref 0–44)
AST: 85 U/L — ABNORMAL HIGH (ref 15–41)
Alkaline Phosphatase: 78 U/L (ref 38–126)
Anion gap: 7 (ref 5–15)
BUN: <5 mg/dL — ABNORMAL LOW (ref 6–20)
CO2: 21 mmol/L — ABNORMAL LOW (ref 22–32)
Calcium: 8.6 mg/dL — ABNORMAL LOW (ref 8.9–10.3)
Chloride: 112 mmol/L — ABNORMAL HIGH (ref 98–111)
Creatinine, Ser: 0.59 mg/dL (ref 0.44–1.00)
GFR calc Af Amer: >60 mL/min (ref >60)
GFR calc non Af Amer: >60 mL/min (ref >60)
Glucose, Bld: 103 mg/dL — ABNORMAL HIGH (ref 70–99)
Potassium: 3.6 mmol/L (ref 3.5–5.1)
Sodium: 140 mmol/L (ref 135–145)
Total Bilirubin: 0.6 mg/dL (ref 0.3–1.2)
Total Protein: 6.4 g/dL — ABNORMAL LOW (ref 6.5–8.1)

## 2018-01-15 LAB — MAGNESIUM: Magnesium: 2.2 mg/dL (ref 1.7–2.4)

## 2018-01-15 MED ORDER — OXYCODONE-ACETAMINOPHEN 7.5-325 MG PO TABS
1.0000 | ORAL_TABLET | Freq: Once | ORAL | Status: AC
  Administered 2018-01-15: 1 via ORAL
  Filled 2018-01-15: qty 1

## 2018-01-15 MED ORDER — OXYCODONE-ACETAMINOPHEN 7.5-325 MG PO TABS: 1.0000 | ORAL_TABLET | Freq: Four times a day (QID) | ORAL | 0 refills | Status: DC | PRN

## 2018-01-15 MED ORDER — AMOXICILLIN-POT CLAVULANATE 875-125 MG PO TABS: 1.0000 | ORAL_TABLET | Freq: Two times a day (BID) | ORAL | 0 refills | Status: DC

## 2018-01-15 NOTE — Progress Notes (Signed)
PROGRESS NOTE  Kendra Ho ZOX:[id number] DOB: 1980/07/06 DOA: 01/14/2018 PCP: Patient, No Pcp Per   LOS: 0 days   Brief Narrative / Interim history: 37 year old female with history of anxiety and hepatitis C presenting with right upper quadrant pain and nausea.  CT abdomen concerning for cholecystitis.  Started on Zosyn.  General surgery consulted on admission.  Subjective: No major events overnight.  Still with some nausea and epigastric pain but improved.  Denies emesis, diarrhea, chest pain, shortness of breath, fever or chills.  N.p.o.  Assessment & Plan: Principal Problem:   Cholecystitis Active Problems:   Anxiety Cholecystitis: CT suggestive for cholecystitis.  Patient has positive Murphy sign.  No fever or leukocytosis.  Mild liver enzyme elevations as well. -General surgery on board. -N.p.o. -Follow RUQ ultrasound -Continue antibiotic and IV fluids. -Continue pain management.  Mood disorder/anxiety: Stable. -Continue home BuSpar and Seroquel.  Hypokalemia: Resolved -Continue monitoring  Scheduled Meds: . busPIRone  10 mg Oral BID  . QUEtiapine  50 mg Oral QHS   Continuous Infusions: . 0.9 % NaCl with KCl 20 mEq / L 100 mL/hr at 01/14/18 2245  . piperacillin-tazobactam (ZOSYN)  IV 3.375 g (01/15/18 0539)   PRN Meds:.acetaminophen **OR** acetaminophen, fentaNYL (SUBLIMAZE) injection, ondansetron **OR** ondansetron (ZOFRAN) IV  DVT prophylaxis: SCD Code Status: Full code Family Communication: Family member at bedside Disposition Plan: Remains inpatient pending surgical input.  Consultants:   General surgery  Procedures:   None  Antimicrobials:  Zosyn 11/28-->  Objective: Vitals:   01/14/18 2100 01/14/18 2209 01/15/18 0057 01/15/18 0624  BP: 130/87 119/77 108/66 (!) 92/58  Pulse: 77 79 71 68  Resp: 17  17 15   Temp:  98.4 F (36.9 C) 97.8 F (36.6 C) 97.7 F (36.5 C)  TempSrc:  Oral Oral Oral  SpO2: 100% 96% 96% 97%  Weight:        Height:        Intake/Output Summary (Last 24 hours) at 01/15/2018 0849 Last data filed at 01/15/2018 0700 Gross per 24 hour  Intake 825 ml  Output -  Net 825 ml   Filed Weights   01/14/18 1802  Weight: 81.6 kg    Examination:  GENERAL: appears well, no ditress HEENT: PERRLA, MMM NECK: Supple LUNGS:  No IWOB, good air movement, CTAB HEART:  RRR with no M/R/G ABD: Bowel sounds present and normal.  Tenderness over RUQ.  Murphy sign positive. EXT: No edema. NEURO:  awake, alert and oriented appropriately.  No gross deficit PSYCH: calm. Normal affect  Data Reviewed: I have independently reviewed following labs and imaging studies   CBC: Recent Labs  Lab 01/14/18 1845 01/15/18 0618  WBC 8.6 6.1  NEUTROABS 5.9 3.6  HGB 13.1 12.4  HCT 40.5 39.8  MCV 93.3 97.1  PLT 329 214   Basic Metabolic Panel: Recent Labs  Lab 01/14/18 1845 01/15/18 0618  NA 138 140  K 3.2* 3.6  CL 108 112*  CO2 23 21*  GLUCOSE 99 103*  BUN 6 <5*  CREATININE 0.79 0.59  CALCIUM 9.1 8.6*  MG  --  2.2   GFR: Estimated Creatinine Clearance: 92.8 mL/min (by C-G formula based on SCr of 0.59 mg/dL). Liver Function Tests: Recent Labs  Lab 01/14/18 1845 01/15/18 0618  AST 45* 85*  ALT 49* 80*  ALKPHOS 95 78  BILITOT 0.5 0.6  PROT 7.0 6.4*  ALBUMIN 3.6 3.2*   Recent Labs  Lab 01/14/18 1845  LIPASE 30   No results for  input(s): AMMONIA in the last 168 hours. Coagulation Profile: No results for input(s): INR, PROTIME in the last 168 hours. Cardiac Enzymes: No results for input(s): CKTOTAL, CKMB, CKMBINDEX, TROPONINI in the last 168 hours. BNP (last 3 results) No results for input(s): PROBNP in the last 8760 hours. HbA1C: No results for input(s): HGBA1C in the last 72 hours. CBG: No results for input(s): GLUCAP in the last 168 hours. Lipid Profile: No results for input(s): CHOL, HDL, LDLCALC, TRIG, CHOLHDL, LDLDIRECT in the last 72 hours. Thyroid Function Tests: No results for  input(s): TSH, T4TOTAL, FREET4, T3FREE, THYROIDAB in the last 72 hours. Anemia Panel: No results for input(s): VITAMINB12, FOLATE, FERRITIN, TIBC, IRON, RETICCTPCT in the last 72 hours. Urine analysis:    Component Value Date/Time   COLORURINE YELLOW 01/14/2018 1835   APPEARANCEUR CLOUDY (A) 01/14/2018 1835   LABSPEC 1.014 01/14/2018 1835   PHURINE 7.0 01/14/2018 1835   GLUCOSEU NEGATIVE 01/14/2018 1835   HGBUR SMALL (A) 01/14/2018 1835   BILIRUBINUR NEGATIVE 01/14/2018 1835   KETONESUR NEGATIVE 01/14/2018 1835   PROTEINUR NEGATIVE 01/14/2018 1835   NITRITE NEGATIVE 01/14/2018 1835   LEUKOCYTESUR TRACE (A) 01/14/2018 1835   Sepsis Labs: Invalid input(s): PROCALCITONIN, LACTICIDVEN  No results found for this or any previous visit (from the past 240 hour(s)).    Radiology Studies: Ct Abdomen Pelvis W Contrast  Result Date: 01/14/2018 CLINICAL DATA:  Epigastric abdominal pain for 2 nights. Leukocytosis. Fever. Normal lipase. EXAM: CT ABDOMEN AND PELVIS WITH CONTRAST TECHNIQUE: Multidetector CT imaging of the abdomen and pelvis was performed using the standard protocol following bolus administration of intravenous contrast. CONTRAST:  100mL ISOVUE-300 IOPAMIDOL (ISOVUE-300) INJECTION 61% COMPARISON:  10/02/2015 CT abdomen/pelvis. FINDINGS: Lower chest: No significant pulmonary nodules or acute consolidative airspace disease. Hepatobiliary: Normal liver size. No liver mass. Mild diffuse gallbladder wall thickening and gallbladder wall mucosal hyperenhancement with pericholecystic fat stranding. No radiopaque cholelithiasis. No biliary ductal dilatation. CBD diameter 5 mm. Pancreas: Normal, with no mass or duct dilation. Spleen: Normal size. No mass. Adrenals/Urinary Tract: Normal adrenals. Nonobstructing 2 mm lower right renal stone. Punctate nonobstructing 1 mm lower left renal stones. No hydronephrosis. Subcentimeter hypodense renal cortical lesions in the upper right and lower left kidney  are too small to characterize and require no follow-up. Normal bladder. Stomach/Bowel: Normal non-distended stomach. Normal caliber small bowel with no small bowel wall thickening. Normal appendix. Normal large bowel with no diverticulosis, large bowel wall thickening or pericolonic fat stranding. Vascular/Lymphatic: Normal caliber abdominal aorta. Patent portal, splenic, hepatic and renal veins. No pathologically enlarged lymph nodes in the abdomen or pelvis. Reproductive: Grossly normal uterus.  No adnexal mass. Other: No pneumoperitoneum, ascites or focal fluid collection. Musculoskeletal: No aggressive appearing focal osseous lesions. IMPRESSION: 1. Nonspecific mild diffuse gallbladder wall thickening and mucosal hyperenhancement with pericholecystic fat stranding. No radiopaque cholelithiasis. If there is clinical concern for acute cholecystitis, right upper quadrant abdominal ultrasound correlation is suggested. 2. No biliary ductal dilatation. 3. No evidence of bowel obstruction or acute bowel inflammation. Normal appendix. 4. Nonobstructing punctate bilateral nephrolithiasis. Electronically Signed   By: Delbert PhenixJason A Poff M.D.   On: 01/14/2018 20:35    Ivee Poellnitz T. [facility] Pager [phone]  If 7PM-7AM, please contact night-coverage [email or web address] Password [email or web address] 01/15/2018, 8:49 AM

## 2018-01-15 NOTE — Discharge Summary (Signed)
Physician Discharge Summary  Kendra Ho ION:[id number] DOB: 1980-09-06 DOA: 01/14/2018  PCP: Patient, No Pcp Per  Admit date: 01/14/2018 Discharge date: 01/15/2018  Admitted From: Home Disposition: Home  Recommendations for Outpatient Follow-up:  1. Follow up with general surgery in 1 week 2. Recommend repeat LFT in 1 week 3. Acute hepatitis panel and HIV results pending at the time of discharge.  Home Health: None Equipment/Devices: None  Discharge Condition: Stable CODE STATUS: Full code Diet recommendation:  -Low-fat diet  Hospital Course: 37 year old female with history of anxiety and hepatitis C presenting with right upper quadrant pain and nausea.  CT abdomen and RUQ ultrasound concerning for cholecystitis.  Started on Zosyn.  General surgery consulted and discussed options of treatment with patient.  "As surgery is somewhat more difficult with a contracted gallbladder, I would like to see whether her symptoms resolve with pain medication and a short course of antibiotics.  I told her that more likely we could do this surgery as an elective procedure.  She is agreeable to that.  We will advance her diet as tolerated.  Should she be discharged later in the day, I will see her in follow-up in my office next week". Patient tolerated diet advancement of the day and requested for discharge in the evening to follow-up with general surgery. Accordingly, she was discharged on Augmentin for 7 days.  Of note, patient's liver enzymes are mildly elevated.  Acute hepatitis panel and HIV results are pending at the time of discharge.  Discharge Diagnoses:  Principal Problem:   Cholecystitis Active Problems:   Anxiety   Acute cholecystitis   Discharge Instructions  Discharge Instructions    Call MD for:  persistant nausea and vomiting   Complete by:  As directed    Call MD for:  severe uncontrolled pain   Complete by:  As directed    Call MD for:  temperature >100.4    Complete by:  As directed    Increase activity slowly   Complete by:  As directed      Allergies as of 01/15/2018      Reactions   Risperdal [risperidone] Anaphylaxis   Bee Venom       Medication List    STOP taking these medications   acetaminophen 500 MG tablet Commonly known as:  TYLENOL   metroNIDAZOLE 500 MG tablet Commonly known as:  FLAGYL     TAKE these medications   amoxicillin-clavulanate 875-125 MG tablet Commonly known as:  AUGMENTIN Take 1 tablet by mouth 2 (two) times daily for 7 days.   busPIRone 10 MG tablet Commonly known as:  BUSPAR Take 10 mg by mouth 2 (two) times daily.   fluticasone 50 MCG/ACT nasal spray Commonly known as:  FLONASE Place 2 sprays into both nostrils daily.   QUEtiapine 50 MG tablet Commonly known as:  SEROQUEL Take 1 tablet by mouth at bedtime.   triamcinolone ointment 0.1 % Commonly known as:  KENALOG Apply 1 application topically 2 (two) times daily as needed.      Follow-up Information    Franky Macho, MD. Schedule an appointment as soon as possible for a visit on 01/21/2018.   Specialty:  General Surgery Contact information: [address] [place] [address] [phone]           Consultations:  General surgery  Procedures/Studies:  Ct Abdomen Pelvis W Contrast  Result Date: 01/14/2018 CLINICAL DATA:  Epigastric abdominal pain for 2 nights. Leukocytosis. Fever. Normal lipase. EXAM: CT  ABDOMEN AND PELVIS WITH CONTRAST TECHNIQUE: Multidetector CT imaging of the abdomen and pelvis was performed using the standard protocol following bolus administration of intravenous contrast. CONTRAST:  100mL ISOVUE-300 IOPAMIDOL (ISOVUE-300) INJECTION 61% COMPARISON:  10/02/2015 CT abdomen/pelvis. FINDINGS: Lower chest: No significant pulmonary nodules or acute consolidative airspace disease. Hepatobiliary: Normal liver size. No liver mass. Mild diffuse gallbladder wall thickening and gallbladder wall mucosal  hyperenhancement with pericholecystic fat stranding. No radiopaque cholelithiasis. No biliary ductal dilatation. CBD diameter 5 mm. Pancreas: Normal, with no mass or duct dilation. Spleen: Normal size. No mass. Adrenals/Urinary Tract: Normal adrenals. Nonobstructing 2 mm lower right renal stone. Punctate nonobstructing 1 mm lower left renal stones. No hydronephrosis. Subcentimeter hypodense renal cortical lesions in the upper right and lower left kidney are too small to characterize and require no follow-up. Normal bladder. Stomach/Bowel: Normal non-distended stomach. Normal caliber small bowel with no small bowel wall thickening. Normal appendix. Normal large bowel with no diverticulosis, large bowel wall thickening or pericolonic fat stranding. Vascular/Lymphatic: Normal caliber abdominal aorta. Patent portal, splenic, hepatic and renal veins. No pathologically enlarged lymph nodes in the abdomen or pelvis. Reproductive: Grossly normal uterus.  No adnexal mass. Other: No pneumoperitoneum, ascites or focal fluid collection. Musculoskeletal: No aggressive appearing focal osseous lesions. IMPRESSION: 1. Nonspecific mild diffuse gallbladder wall thickening and mucosal hyperenhancement with pericholecystic fat stranding. No radiopaque cholelithiasis. If there is clinical concern for acute cholecystitis, right upper quadrant abdominal ultrasound correlation is suggested. 2. No biliary ductal dilatation. 3. No evidence of bowel obstruction or acute bowel inflammation. Normal appendix. 4. Nonobstructing punctate bilateral nephrolithiasis. Electronically Signed   By: Delbert PhenixJason A Poff M.D.   On: 01/14/2018 20:35   [place] Abdomen Limited Ruq  Result Date: 01/15/2018 CLINICAL DATA:  Cholecystitis EXAM: ULTRASOUND ABDOMEN LIMITED RIGHT UPPER QUADRANT COMPARISON:  CT 01/14/2018 FINDINGS: Gallbladder: Gallbladder is contracted. No visible stones. There is gallbladder wall thickening measuring up to 5 mm. The patient was tender over  the gallbladder during the study. Sludge seen within the gallbladder. Common bile duct: Diameter: Normal caliber, 4 mm Liver: No focal lesion identified. Within normal limits in parenchymal echogenicity. Portal vein is patent on color Doppler imaging with normal direction of blood flow towards the liver. IMPRESSION: Contracted gallbladder with a small amount of sludge within the lumen and gallbladder wall thickening. The patient was tender over the gallbladder during the study. Findings concerning for acute cholecystitis. No visible stones. Electronically Signed   By: Charlett NoseKevin  Dover M.D.   On: 01/15/2018 09:52    Discharge Exam: Vitals:   01/15/18 0624 01/15/18 1455  BP: (!) 92/58 102/66  Pulse: 68 61  Resp: 15 18  Temp: 97.7 F (36.5 C) 98.1 F (36.7 C)  SpO2: 97% 99%    GENERAL: appears well, no ditress HEENT: PERRLA, MMM NECK: Supple LUNGS: No IWOB, good air movement, CTAB HEART: RRR with no M/R/G ABD: Bowel sounds present and normal.  Tenderness over RUQ.  Murphy sign positive. EXT: No edema. NEURO: awake, alert and oriented appropriately.  No gross deficit PSYCH: calm. Normal affect    The results of significant diagnostics from this hospitalization (including imaging, microbiology, ancillary and laboratory) are listed below for reference.     Microbiology: No results found for this or any previous visit (from the past 240 hour(s)).   Labs: BNP (last 3 results) No results for input(s): BNP in the last 8760 hours. Basic Metabolic Panel: Recent Labs  Lab 01/14/18 1845 01/15/18 0618  NA 138 140  K  3.2* 3.6  CL 108 112*  CO2 23 21*  GLUCOSE 99 103*  BUN 6 <5*  CREATININE 0.79 0.59  CALCIUM 9.1 8.6*  MG  --  2.2   Liver Function Tests: Recent Labs  Lab 01/14/18 1845 01/15/18 0618  AST 45* 85*  ALT 49* 80*  ALKPHOS 95 78  BILITOT 0.5 0.6  PROT 7.0 6.4*  ALBUMIN 3.6 3.2*   Recent Labs  Lab 01/14/18 1845  LIPASE 30   No results for input(s): AMMONIA  in the last 168 hours. CBC: Recent Labs  Lab 01/14/18 1845 01/15/18 0618  WBC 8.6 6.1  NEUTROABS 5.9 3.6  HGB 13.1 12.4  HCT 40.5 39.8  MCV 93.3 97.1  PLT 329 214   Cardiac Enzymes: No results for input(s): CKTOTAL, CKMB, CKMBINDEX, TROPONINI in the last 168 hours. BNP: Invalid input(s): POCBNP CBG: No results for input(s): GLUCAP in the last 168 hours. D-Dimer No results for input(s): DDIMER in the last 72 hours. Hgb A1c No results for input(s): HGBA1C in the last 72 hours. Lipid Profile No results for input(s): CHOL, HDL, LDLCALC, TRIG, CHOLHDL, LDLDIRECT in the last 72 hours. Thyroid function studies No results for input(s): TSH, T4TOTAL, T3FREE, THYROIDAB in the last 72 hours.  Invalid input(s): FREET3 Anemia work up No results for input(s): VITAMINB12, FOLATE, FERRITIN, TIBC, IRON, RETICCTPCT in the last 72 hours. Urinalysis    Component Value Date/Time   COLORURINE YELLOW 01/14/2018 1835   APPEARANCEUR CLOUDY (A) 01/14/2018 1835   LABSPEC 1.014 01/14/2018 1835   PHURINE 7.0 01/14/2018 1835   GLUCOSEU NEGATIVE 01/14/2018 1835   HGBUR SMALL (A) 01/14/2018 1835   BILIRUBINUR NEGATIVE 01/14/2018 1835   KETONESUR NEGATIVE 01/14/2018 1835   PROTEINUR NEGATIVE 01/14/2018 1835   NITRITE NEGATIVE 01/14/2018 1835   LEUKOCYTESUR TRACE (A) 01/14/2018 1835   Sepsis Labs Invalid input(s): PROCALCITONIN,  WBC,  LACTICIDVEN   Time coordinating discharge: 25 minutes  SIGNED:  Almon Hercules, MD  [facility] 01/15/2018, 6:11 PM Pager [phone]  If 7PM-7AM, please contact night-coverage [email or web address] Password [email or web address]

## 2018-01-15 NOTE — Consult Note (Signed)
Reason for Consult: Right upper quadrant abdominal pain Referring Physician: Dr. Ginny Ho is an 37 y.o. female.  HPI: Patient is a 37 year old white female who presented in the emergency room with an acute onset of right upper quadrant abdominal pain.  She states that she may have had a similar episode in the past, but it went away spontaneously.  She started having right upper quadrant abdominal pain several days ago after eating, but did not resolve.  She did have nausea, but no vomiting.  She denies any fevers.  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  She does have a history of hepatitis C.  She was admitted to the hospital after CT scan of the abdomen revealed cholecystitis.  No stones were seen.  She had an ultrasound today which showed a contracted gallbladder with a thickened gallbladder wall, but no cholelithiasis.  After her ultrasound, she stated that she felt slightly better.  Past Medical History:  Diagnosis Date  . Anxiety   . Panic attack     History reviewed. No pertinent surgical history.  Family History  Problem Relation Age of Onset  . Obesity Mother     Social History:  reports that she has been smoking cigarettes. She has been smoking about 1.00 pack per day. She has never used smokeless tobacco. She reports that she drinks alcohol. She reports that she does not use drugs.  Allergies:  Allergies  Allergen Reactions  . Risperdal [Risperidone] Anaphylaxis  . Bee Venom     Medications: I have reviewed the patient's current medications.  Results for orders placed or performed during the hospital encounter of 01/14/18 (from the past 48 hour(s))  Urinalysis, Routine w reflex microscopic     Status: Abnormal   Collection Time: 01/14/18  6:35 PM  Result Value Ref Range   Color, Urine YELLOW YELLOW   APPearance CLOUDY (A) CLEAR   Specific Gravity, Urine 1.014 1.005 - 1.030    pH 7.0 5.0 - 8.0   Glucose, UA NEGATIVE NEGATIVE mg/dL   Hgb urine dipstick SMALL (A) NEGATIVE   Bilirubin Urine NEGATIVE NEGATIVE   Ketones, ur NEGATIVE NEGATIVE mg/dL   Protein, ur NEGATIVE NEGATIVE mg/dL   Nitrite NEGATIVE NEGATIVE   Leukocytes, UA TRACE (A) NEGATIVE   RBC / HPF 0-5 0 - 5 RBC/hpf   WBC, UA 0-5 0 - 5 WBC/hpf   Bacteria, UA RARE (A) NONE SEEN   Squamous Epithelial / LPF 21-50 0 - 5   Mucus PRESENT    Hyaline Casts, UA PRESENT     Comment: Performed at [facility], [address], [place], [place] [address]  Lipase, blood     Status: None   Collection Time: 01/14/18  6:45 PM  Result Value Ref Range   Lipase 30 11 - 51 U/L    Comment: Performed at [facility], [address], [place], [place] [address]  Comprehensive metabolic panel     Status: Abnormal   Collection Time: 01/14/18  6:45 PM  Result Value Ref Range   Sodium 138 135 - 145 mmol/L   Potassium 3.2 (L) 3.5 - 5.1 mmol/L   Chloride 108 98 - 111 mmol/L   CO2 23 22 - 32 mmol/L   Glucose, Bld 99 70 - 99 mg/dL   BUN 6 6 - 20 mg/dL   Creatinine, Ser 0.98 0.44 - 1.00 mg/dL   Calcium  9.1 8.9 - 10.3 mg/dL   Total Protein 7.0 6.5 - 8.1 g/dL   Albumin 3.6 3.5 - 5.0 g/dL   AST 45 (H) 15 - 41 U/L   ALT 49 (H) 0 - 44 U/L   Alkaline Phosphatase 95 38 - 126 U/L   Total Bilirubin 0.5 0.3 - 1.2 mg/dL   GFR calc non Af Amer >60 >60 mL/min   GFR calc Af Amer >60 >60 mL/min   Anion gap 7 5 - 15    Comment: Performed at [facility], [address] [place], [place] [address]  CBC with Differential/Platelet     Status: None   Collection Time: 01/14/18  6:45 PM  Result Value Ref Range   WBC 8.6 4.0 - 10.5 K/uL   RBC 4.34 3.87 - 5.11 MIL/uL   Hemoglobin 13.1 12.0 - 15.0 g/dL   HCT 60.4 54.0 - 98.1 %   MCV 93.3 80.0 - 100.0 fL   MCH 30.2 26.0 - 34.0 pg   MCHC 32.3 30.0 - 36.0 g/dL   RDW 19.1 47.8 - 29.5 %   Platelets 329 150 - 400 K/uL   nRBC 0.0 0.0 - 0.2 %   Neutrophils Relative % 68 %   Neutro Abs 5.9 1.7 -  7.7 K/uL   Lymphocytes Relative 23 %   Lymphs Abs 2.0 0.7 - 4.0 K/uL   Monocytes Relative 5 %   Monocytes Absolute 0.5 0.1 - 1.0 K/uL   Eosinophils Relative 3 %   Eosinophils Absolute 0.2 0.0 - 0.5 K/uL   Basophils Relative 1 %   Basophils Absolute 0.1 0.0 - 0.1 K/uL   Immature Granulocytes 0 %   Abs Immature Granulocytes 0.02 0.00 - 0.07 K/uL    Comment: Performed at [facility], [address], [place], [place] [address]  Comprehensive metabolic panel     Status: Abnormal   Collection Time: 01/15/18  6:18 AM  Result Value Ref Range   Sodium 140 135 - 145 mmol/L   Potassium 3.6 3.5 - 5.1 mmol/L   Chloride 112 (H) 98 - 111 mmol/L   CO2 21 (L) 22 - 32 mmol/L   Glucose, Bld 103 (H) 70 - 99 mg/dL   BUN <5 (L) 6 - 20 mg/dL   Creatinine, Ser 8.65 0.44 - 1.00 mg/dL   Calcium 8.6 (L) 8.9 - 10.3 mg/dL   Total Protein 6.4 (L) 6.5 - 8.1 g/dL   Albumin 3.2 (L) 3.5 - 5.0 g/dL   AST 85 (H) 15 - 41 U/L   ALT 80 (H) 0 - 44 U/L   Alkaline Phosphatase 78 38 - 126 U/L   Total Bilirubin 0.6 0.3 - 1.2 mg/dL   GFR calc non Af Amer >60 >60 mL/min   GFR calc Af Amer >60 >60 mL/min   Anion gap 7 5 - 15    Comment: Performed at [facility], [address] [place], [place] [address]  CBC WITH DIFFERENTIAL     Status: None   Collection Time: 01/15/18  6:18 AM  Result Value Ref Range   WBC 6.1 4.0 - 10.5 K/uL   RBC 4.10 3.87 - 5.11 MIL/uL   Hemoglobin 12.4 12.0 - 15.0 g/dL   HCT 62.9 52.8 - 41.3 %   MCV 97.1 80.0 - 100.0 fL   MCH 30.2 26.0 - 34.0 pg   MCHC 31.2 30.0 - 36.0 g/dL   RDW 24.4 01.0 - 27.2 %   Platelets 214 150 - 400 K/uL  Comment: REPEATED TO VERIFY PLATELET COUNT CONFIRMED BY SMEAR SPECIMEN CHECKED FOR CLOTS    nRBC 0.0 0.0 - 0.2 %   Neutrophils Relative % 59 %   Neutro Abs 3.6 1.7 - 7.7 K/uL   Lymphocytes Relative 31 %   Lymphs Abs 1.9 0.7 - 4.0 K/uL   Monocytes Relative 5 %   Monocytes Absolute 0.3 0.1 - 1.0 K/uL   Eosinophils Relative 4 %   Eosinophils Absolute 0.2 0.0  - 0.5 K/uL   Basophils Relative 1 %   Basophils Absolute 0.1 0.0 - 0.1 K/uL   Immature Granulocytes 0 %   Abs Immature Granulocytes 0.01 0.00 - 0.07 K/uL    Comment: Performed at [facility], [address] [place], [place] [address]  Magnesium     Status: None   Collection Time: 01/15/18  6:18 AM  Result Value Ref Range   Magnesium 2.2 1.7 - 2.4 mg/dL    Comment: Performed at [facility], [address] [place], [place] [address]    Ct Abdomen Pelvis W Contrast  Result Date: 01/14/2018 CLINICAL DATA:  Epigastric abdominal pain for 2 nights. Leukocytosis. Fever. Normal lipase. EXAM: CT ABDOMEN AND PELVIS WITH CONTRAST TECHNIQUE: Multidetector CT imaging of the abdomen and pelvis was performed using the standard protocol following bolus administration of intravenous contrast. CONTRAST:  100mL ISOVUE-300 IOPAMIDOL (ISOVUE-300) INJECTION 61% COMPARISON:  10/02/2015 CT abdomen/pelvis. FINDINGS: Lower chest: No significant pulmonary nodules or acute consolidative airspace disease. Hepatobiliary: Normal liver size. No liver mass. Mild diffuse gallbladder wall thickening and gallbladder wall mucosal hyperenhancement with pericholecystic fat stranding. No radiopaque cholelithiasis. No biliary ductal dilatation. CBD diameter 5 mm. Pancreas: Normal, with no mass or duct dilation. Spleen: Normal size. No mass. Adrenals/Urinary Tract: Normal adrenals. Nonobstructing 2 mm lower right renal stone. Punctate nonobstructing 1 mm lower left renal stones. No hydronephrosis. Subcentimeter hypodense renal cortical lesions in the upper right and lower left kidney are too small to characterize and require no follow-up. Normal bladder. Stomach/Bowel: Normal non-distended stomach. Normal caliber small bowel with no small bowel wall thickening. Normal appendix. Normal large bowel with no diverticulosis, large bowel wall thickening or pericolonic fat stranding. Vascular/Lymphatic: Normal caliber abdominal aorta. Patent  portal, splenic, hepatic and renal veins. No pathologically enlarged lymph nodes in the abdomen or pelvis. Reproductive: Grossly normal uterus.  No adnexal mass. Other: No pneumoperitoneum, ascites or focal fluid collection. Musculoskeletal: No aggressive appearing focal osseous lesions. IMPRESSION: 1. Nonspecific mild diffuse gallbladder wall thickening and mucosal hyperenhancement with pericholecystic fat stranding. No radiopaque cholelithiasis. If there is clinical concern for acute cholecystitis, right upper quadrant abdominal ultrasound correlation is suggested. 2. No biliary ductal dilatation. 3. No evidence of bowel obstruction or acute bowel inflammation. Normal appendix. 4. Nonobstructing punctate bilateral nephrolithiasis. Electronically Signed   By: Delbert PhenixJason A Poff M.D.   On: 01/14/2018 20:35   [place] Abdomen Limited Ruq  Result Date: 01/15/2018 CLINICAL DATA:  Cholecystitis EXAM: ULTRASOUND ABDOMEN LIMITED RIGHT UPPER QUADRANT COMPARISON:  CT 01/14/2018 FINDINGS: Gallbladder: Gallbladder is contracted. No visible stones. There is gallbladder wall thickening measuring up to 5 mm. The patient was tender over the gallbladder during the study. Sludge seen within the gallbladder. Common bile duct: Diameter: Normal caliber, 4 mm Liver: No focal lesion identified. Within normal limits in parenchymal echogenicity. Portal vein is patent on color Doppler imaging with normal direction of blood flow towards the liver. IMPRESSION: Contracted gallbladder with a small amount of sludge within the lumen and gallbladder wall thickening. The patient  was tender over the gallbladder during the study. Findings concerning for acute cholecystitis. No visible stones. Electronically Signed   By: Charlett Nose M.D.   On: 01/15/2018 09:52    ROS:  Pertinent items are noted in HPI.  Blood pressure (!) 92/58, pulse 68, temperature 97.7 F (36.5 C), temperature source Oral, resp. rate 15, height 5' (1.524 m), weight 81.6 kg, last  menstrual period 01/11/2018, SpO2 97 %. Physical Exam: Pleasant well-developed well-nourished white female no acute distress Head is normocephalic, atraumatic Eyes without scleral icterus Lungs clear to auscultation with good breath sounds bilaterally Heart examination reveals regular rate and rhythm without S3, S4, murmurs Abdomen is soft with moderate tenderness in the right upper quadrant to palpation.  No specific point tenderness is noted.  No rigidity is noted.  Bowel sounds are present.  Ultrasound images personally reviewed CT scan report reviewed  Assessment/Plan: Impression: Right upper quadrant abdominal pain most likely secondary to cholecystitis, though a flareup of her hepatitis C could also give similar findings.  She has no stones and a normal white blood cell count.  As surgery is somewhat more difficult with a contracted gallbladder, I would like to see whether her symptoms resolve with pain medication and a short course of antibiotics.  I told her that more likely we could do this surgery as an elective procedure.  She is agreeable to that.  We will advance her diet as tolerated.  Should she be discharged later in the day, I will see her in follow-up in my office next week.  Kendra Ho 01/15/2018, 10:14 AM

## 2018-01-15 NOTE — Discharge Instructions (Signed)
Cholecystitis  Cholecystitis is inflammation of the gallbladder. It is often called a gallbladder attack. The gallbladder is a pear-shaped organ that lies beneath the liver on the right side of the body. The gallbladder stores bile, which is a fluid that helps the body to digest fats. If bile builds up in your gallbladder, your gallbladder becomes inflamed. This condition may occur suddenly (be acute). Repeat episodes of acute cholecystitis or prolonged episodes may lead to a long-term (chronic) condition. Cholecystitis is serious and it requires treatment.  What are the causes?  The most common cause of this condition is gallstones. Gallstones can block the tube (duct) that carries bile out of your gallbladder. This causes bile to build up. Other causes of this condition include:   Damage to the gallbladder due to a decrease in blood flow.   Infections in the bile ducts.   Scars or kinks in the bile ducts.   Tumors in the liver, pancreas, or gallbladder.    What increases the risk?  This condition is more likely to develop in:   People who have sickle cell disease.   People who take birth control pills or use estrogen.   People who have alcoholic liver disease.   People who have liver cirrhosis.   People who have their nutrition delivered through a vein (parenteral nutrition).   People who do not eat or drink (do fasting) for a long period of time.   People who are obese.   People who have rapid weight loss.   People who are pregnant.   People who have increased triglyceride levels.   People who have pancreatitis.    What are the signs or symptoms?  Symptoms of this condition include:   Abdominal pain, especially in the upper right area of the abdomen.   Abdominal tenderness or bloating.   Nausea.   Vomiting.   Fever.   Chills.   Yellowing of the skin and the whites of the eyes (jaundice).    How is this diagnosed?  This condition is diagnosed with a medical history and physical exam. You may  also have other tests, including:   Imaging tests, such as:  ? An ultrasound of the gallbladder.  ? A CT scan of the abdomen.  ? A gallbladder nuclear scan (HIDA scan). This scan allows your health care provider to see the bile moving from your liver to your gallbladder and to your small intestine.  ? MRI.   Blood tests, such as:  ? A complete blood count, because the white blood cell count may be higher than normal.  ? Liver function tests, because some levels may be higher than normal with certain types of gallstones.    How is this treated?  Treatment may include:   Fasting for a certain amount of time.   IV fluids.   Medicine to treat pain or vomiting.   Antibiotic medicine.   Surgery to remove your gallbladder (cholecystectomy). This may happen immediately or at a later time.    Follow these instructions at home:  Home care will depend on your treatment. In general:   Take over-the-counter and prescription medicines only as told by your health care provider.   If you were prescribed an antibiotic medicine, take it as told by your health care provider. Do not stop taking the antibiotic even if you start to feel better.   Follow instructions from your health care provider about what to eat or drink. When you are allowed to eat,   avoid eating or drinking anything that triggers your symptoms.   Keep all follow-up visits as told by your health care provider. This is important.    Contact a health care provider if:   Your pain is not controlled with medicine.   You have a fever.  Get help right away if:   Your pain moves to another part of your abdomen or to your back.   You continue to have symptoms or you develop new symptoms even with treatment.  This information is not intended to replace advice given to you by your health care provider. Make sure you discuss any questions you have with your health care provider.  Document Released: 02/03/2005 Document Revised: 06/14/2015 Document Reviewed:  05/17/2014  Elsevier Interactive Patient Education  2018 [employer]vier Inc[employer]

## 2018-01-16 LAB — HEPATITIS PANEL, ACUTE
HCV Ab: >11 s/co ratio — ABNORMAL HIGH (ref 0.0–0.9)
Hep A IgM: NEGATIVE
Hep B C IgM: NEGATIVE
Hepatitis B Surface Ag: NEGATIVE

## 2018-01-16 LAB — HIV ANTIBODY (ROUTINE TESTING W REFLEX): HIV Screen 4th Generation wRfx: NONREACTIVE

## 2018-01-21 ENCOUNTER — Encounter: Payer: Self-pay | Admitting: General Surgery

## 2018-01-21 ENCOUNTER — Encounter (HOSPITAL_COMMUNITY): Payer: Self-pay

## 2018-01-21 ENCOUNTER — Ambulatory Visit (INDEPENDENT_AMBULATORY_CARE_PROVIDER_SITE_OTHER): Payer: Self-pay | Admitting: General Surgery

## 2018-01-21 VITALS — BP 113/65 | HR 72 | Temp 98.0°F | Resp 18 | Wt 183.2 lb

## 2018-01-21 DIAGNOSIS — K838 Other specified diseases of biliary tract: Secondary | ICD-10-CM

## 2018-01-21 NOTE — Progress Notes (Signed)
Subjective:     Kendra Ho  Patient here for follow-up after hospitalization for right upper quadrant abdominal pain secondary to biliary sludge.  Patient states her abdominal pain has eased, but she would like to proceed with surgery as she has had multiple attacks over the past few months.  She does have also a history of hepatitis C.  She is seeing a GI doctor in January 2020. Objective:    BP 113/65 (BP Location: Left Arm, Patient Position: Sitting, Cuff Size: Normal)   Pulse 72   Temp 98 F (36.7 C) (Temporal)   Resp 18   Wt 183 lb 3.2 oz (83.1 kg)   LMP 01/11/2018   BMI 35.78 kg/m   General:  alert, cooperative and no distress       Assessment:    Biliary colic secondary to biliary sludge, history of hepatitis C    Plan:   Patient is scheduled for laparoscopic cholecystectomy, liver biopsy on 01/25/2018.  The risks and benefits of the procedure including bleeding, infection, hepatobiliary injury, and the possibility of an open procedure were fully explained to the patient, who gave informed consent.

## 2018-01-21 NOTE — H&P (Signed)
Reason for Consult: Right upper quadrant abdominal pain Referring Physician: Dr. [name] is an 37 y.o. female.  HPI: Patient is a 37 year old white female who presented in the emergency room with an acute onset of right upper quadrant abdominal pain.  She states that she may have had a similar episode in the past, but it went away spontaneously.  She started having right upper quadrant abdominal pain several days ago after eating, but did not resolve.  She did have nausea, but no vomiting.  She denies any fevers.  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  She does have a history of hepatitis C.  She was admitted to the hospital after CT scan of the abdomen revealed cholecystitis.  No stones were seen.  She had an ultrasound today which showed a contracted gallbladder with a thickened gallbladder wall, but no cholelithiasis.  After her ultrasound, she stated that she felt slightly better.      Past Medical History:  Diagnosis Date  . Anxiety   . Panic attack     History reviewed. No pertinent surgical history.       Family History  Problem Relation Age of Onset  . Obesity Mother     Social History:  reports that she has been smoking cigarettes. She has been smoking about 1.00 pack per day. She has never used smokeless tobacco. She reports that she drinks alcohol. She reports that she does not use drugs.  Allergies:      Allergies  Allergen Reactions  . Risperdal [Risperidone] Anaphylaxis  . Bee Venom     Medications: I have reviewed the patient's current medications.  LabResultsLast48Hours  Results for orders placed or performed during the hospital encounter of 01/14/18 (from the past 48 hour(s))  Urinalysis, Routine w reflex microscopic     Status: Abnormal   Collection Time: 01/14/18  6:35 PM  Result Value Ref Range   Color, Urine YELLOW YELLOW   APPearance  CLOUDY (A) CLEAR   Specific Gravity, Urine 1.014 1.005 - 1.030   pH 7.0 5.0 - 8.0   Glucose, UA NEGATIVE NEGATIVE mg/dL   Hgb urine dipstick SMALL (A) NEGATIVE   Bilirubin Urine NEGATIVE NEGATIVE   Ketones, ur NEGATIVE NEGATIVE mg/dL   Protein, ur NEGATIVE NEGATIVE mg/dL   Nitrite NEGATIVE NEGATIVE   Leukocytes, UA TRACE (A) NEGATIVE   RBC / HPF 0-5 0 - 5 RBC/hpf   WBC, UA 0-5 0 - 5 WBC/hpf   Bacteria, UA RARE (A) NONE SEEN   Squamous Epithelial / LPF 21-50 0 - 5   Mucus PRESENT    Hyaline Casts, UA PRESENT     Comment: Performed at [facility], [address] [place], [place] [address]  Lipase, blood     Status: None   Collection Time: 01/14/18  6:45 PM  Result Value Ref Range   Lipase 30 11 - 51 U/L    Comment: Performed at [facility], [address], [place], [place] [address]  Comprehensive metabolic panel     Status: Abnormal   Collection Time: 01/14/18  6:45 PM  Result Value Ref Range   Sodium 138 135 - 145 mmol/L   Potassium 3.2 (L) 3.5 - 5.1 mmol/L   Chloride 108 98 - 111 mmol/L   CO2 23 22 - 32 mmol/L   Glucose, Bld 99 70 - 99 mg/dL   BUN 6 6 -  20 mg/dL   Creatinine, Ser 0.98 0.44 - 1.00 mg/dL   Calcium 9.1 8.9 - 11.9 mg/dL   Total Protein 7.0 6.5 - 8.1 g/dL   Albumin 3.6 3.5 - 5.0 g/dL   AST 45 (H) 15 - 41 U/L   ALT 49 (H) 0 - 44 U/L   Alkaline Phosphatase 95 38 - 126 U/L   Total Bilirubin 0.5 0.3 - 1.2 mg/dL   GFR calc non Af Amer >60 >60 mL/min   GFR calc Af Amer >60 >60 mL/min   Anion gap 7 5 - 15    Comment: Performed at [facility], [address] [place], [place] [address]  CBC with Differential/Platelet     Status: None   Collection Time: 01/14/18  6:45 PM  Result Value Ref Range   WBC 8.6 4.0 - 10.5 K/uL   RBC 4.34 3.87 - 5.11 MIL/uL   Hemoglobin 13.1 12.0 - 15.0 g/dL   HCT 95.6 21.3 - 08.6 %   MCV 93.3 80.0 - 100.0 fL   MCH 30.2 26.0 - 34.0 pg   MCHC 32.3 30.0 - 36.0 g/dL   RDW 57.8 46.9 -  62.9 %   Platelets 329 150 - 400 K/uL   nRBC 0.0 0.0 - 0.2 %   Neutrophils Relative % 68 %   Neutro Abs 5.9 1.7 - 7.7 K/uL   Lymphocytes Relative 23 %   Lymphs Abs 2.0 0.7 - 4.0 K/uL   Monocytes Relative 5 %   Monocytes Absolute 0.5 0.1 - 1.0 K/uL   Eosinophils Relative 3 %   Eosinophils Absolute 0.2 0.0 - 0.5 K/uL   Basophils Relative 1 %   Basophils Absolute 0.1 0.0 - 0.1 K/uL   Immature Granulocytes 0 %   Abs Immature Granulocytes 0.02 0.00 - 0.07 K/uL    Comment: Performed at [facility], [address], [place], [place] [address]  Comprehensive metabolic panel     Status: Abnormal   Collection Time: 01/15/18  6:18 AM  Result Value Ref Range   Sodium 140 135 - 145 mmol/L   Potassium 3.6 3.5 - 5.1 mmol/L   Chloride 112 (H) 98 - 111 mmol/L   CO2 21 (L) 22 - 32 mmol/L   Glucose, Bld 103 (H) 70 - 99 mg/dL   BUN <5 (L) 6 - 20 mg/dL   Creatinine, Ser 3.24 0.44 - 1.00 mg/dL   Calcium 8.6 (L) 8.9 - 10.3 mg/dL   Total Protein 6.4 (L) 6.5 - 8.1 g/dL   Albumin 3.2 (L) 3.5 - 5.0 g/dL   AST 85 (H) 15 - 41 U/L   ALT 80 (H) 0 - 44 U/L   Alkaline Phosphatase 78 38 - 126 U/L   Total Bilirubin 0.6 0.3 - 1.2 mg/dL   GFR calc non Af Amer >60 >60 mL/min   GFR calc Af Amer >60 >60 mL/min   Anion gap 7 5 - 15    Comment: Performed at [facility], [address] [place], [place] [address]  CBC WITH DIFFERENTIAL     Status: None   Collection Time: 01/15/18  6:18 AM  Result Value Ref Range   WBC 6.1 4.0 - 10.5 K/uL   RBC 4.10 3.87 - 5.11 MIL/uL   Hemoglobin 12.4 12.0 - 15.0 g/dL   HCT 72.5 36.6 - 44.0 %   MCV 97.1 80.0 - 100.0 fL   MCH 30.2 26.0 - 34.0 pg   MCHC 31.2 30.0 - 36.0 g/dL   RDW 34.7 42.5 -  15.5 %   Platelets 214 150 - 400 K/uL    Comment: REPEATED TO VERIFY PLATELET COUNT CONFIRMED BY SMEAR SPECIMEN CHECKED FOR CLOTS    nRBC 0.0 0.0 - 0.2 %   Neutrophils Relative % 59 %   Neutro Abs 3.6 1.7 - 7.7 K/uL   Lymphocytes  Relative 31 %   Lymphs Abs 1.9 0.7 - 4.0 K/uL   Monocytes Relative 5 %   Monocytes Absolute 0.3 0.1 - 1.0 K/uL   Eosinophils Relative 4 %   Eosinophils Absolute 0.2 0.0 - 0.5 K/uL   Basophils Relative 1 %   Basophils Absolute 0.1 0.0 - 0.1 K/uL   Immature Granulocytes 0 %   Abs Immature Granulocytes 0.01 0.00 - 0.07 K/uL    Comment: Performed at [facility], [address] [place], [place] [address]  Magnesium     Status: None   Collection Time: 01/15/18  6:18 AM  Result Value Ref Range   Magnesium 2.2 1.7 - 2.4 mg/dL    Comment: Performed at [facility], [address] [place], [place] [address]       ImagingResults(Last48hours)  Ct Abdomen Pelvis W Contrast  Result Date: 01/14/2018 CLINICAL DATA:  Epigastric abdominal pain for 2 nights. Leukocytosis. Fever. Normal lipase. EXAM: CT ABDOMEN AND PELVIS WITH CONTRAST TECHNIQUE: Multidetector CT imaging of the abdomen and pelvis was performed using the standard protocol following bolus administration of intravenous contrast. CONTRAST:  100mL ISOVUE-300 IOPAMIDOL (ISOVUE-300) INJECTION 61% COMPARISON:  10/02/2015 CT abdomen/pelvis. FINDINGS: Lower chest: No significant pulmonary nodules or acute consolidative airspace disease. Hepatobiliary: Normal liver size. No liver mass. Mild diffuse gallbladder wall thickening and gallbladder wall mucosal hyperenhancement with pericholecystic fat stranding. No radiopaque cholelithiasis. No biliary ductal dilatation. CBD diameter 5 mm. Pancreas: Normal, with no mass or duct dilation. Spleen: Normal size. No mass. Adrenals/Urinary Tract: Normal adrenals. Nonobstructing 2 mm lower right renal stone. Punctate nonobstructing 1 mm lower left renal stones. No hydronephrosis. Subcentimeter hypodense renal cortical lesions in the upper right and lower left kidney are too small to characterize and require no follow-up. Normal bladder. Stomach/Bowel: Normal non-distended stomach. Normal caliber  small bowel with no small bowel wall thickening. Normal appendix. Normal large bowel with no diverticulosis, large bowel wall thickening or pericolonic fat stranding. Vascular/Lymphatic: Normal caliber abdominal aorta. Patent portal, splenic, hepatic and renal veins. No pathologically enlarged lymph nodes in the abdomen or pelvis. Reproductive: Grossly normal uterus.  No adnexal mass. Other: No pneumoperitoneum, ascites or focal fluid collection. Musculoskeletal: No aggressive appearing focal osseous lesions. IMPRESSION: 1. Nonspecific mild diffuse gallbladder wall thickening and mucosal hyperenhancement with pericholecystic fat stranding. No radiopaque cholelithiasis. If there is clinical concern for acute cholecystitis, right upper quadrant abdominal ultrasound correlation is suggested. 2. No biliary ductal dilatation. 3. No evidence of bowel obstruction or acute bowel inflammation. Normal appendix. 4. Nonobstructing punctate bilateral nephrolithiasis. Electronically Signed   By: Delbert PhenixJason A Poff M.D.   On: 01/14/2018 20:35   [place] Abdomen Limited Ruq  Result Date: 01/15/2018 CLINICAL DATA:  Cholecystitis EXAM: ULTRASOUND ABDOMEN LIMITED RIGHT UPPER QUADRANT COMPARISON:  CT 01/14/2018 FINDINGS: Gallbladder: Gallbladder is contracted. No visible stones. There is gallbladder wall thickening measuring up to 5 mm. The patient was tender over the gallbladder during the study. Sludge seen within the gallbladder. Common bile duct: Diameter: Normal caliber, 4 mm Liver: No focal lesion identified. Within normal limits in parenchymal echogenicity. Portal vein is patent on color Doppler imaging with normal direction of blood flow towards the liver.  IMPRESSION: Contracted gallbladder with a small amount of sludge within the lumen and gallbladder wall thickening. The patient was tender over the gallbladder during the study. Findings concerning for acute cholecystitis. No visible stones. Electronically Signed   By: Charlett Nose  M.D.   On: 01/15/2018 09:52     ROS:  Pertinent items are noted in HPI.  Blood pressure (!) 92/58, pulse 68, temperature 97.7 F (36.5 C), temperature source Oral, resp. rate 15, height 5' (1.524 m), weight 81.6 kg, last menstrual period 01/11/2018, SpO2 97 %. Physical Exam: Pleasant well-developed well-nourished white female no acute distress Head is normocephalic, atraumatic Eyes without scleral icterus Lungs clear to auscultation with good breath sounds bilaterally Heart examination reveals regular rate and rhythm without S3, S4, murmurs Abdomen is soft with moderate tenderness in the right upper quadrant to palpation.  No specific point tenderness is noted.  No rigidity is noted.  Bowel sounds are present.  Ultrasound images personally reviewed CT scan report reviewed  Assessment/Plan: Impression: Right upper quadrant abdominal pain most likely secondary to cholecystitis, though a flareup of her hepatitis C could also give similar findings.    Patient is scheduled for laparoscopic cholecystectomy with liver biopsy on 01/25/2018.  The risks and benefits of the procedure including bleeding, infection, hepatobiliary injury, and the possibility of an open procedure were fully explained to the patient, who gave informed consent. Franky Macho

## 2018-01-21 NOTE — Patient Instructions (Signed)
Laparoscopic Cholecystectomy Laparoscopic cholecystectomy is surgery to remove the gallbladder. The gallbladder is a pear-shaped organ that lies beneath the liver on the right side of the body. The gallbladder stores bile, which is a fluid that helps the body to digest fats. Cholecystectomy is often done for inflammation of the gallbladder (cholecystitis). This condition is usually caused by a buildup of gallstones (cholelithiasis) in the gallbladder. Gallstones can block the flow of bile, which can result in inflammation and pain. In severe cases, emergency surgery may be required. This procedure is done though small incisions in your abdomen (laparoscopic surgery). A thin scope with a camera (laparoscope) is inserted through one incision. Thin surgical instruments are inserted through the other incisions. In some cases, a laparoscopic procedure may be turned into a type of surgery that is done through a larger incision (open surgery). Tell a health care provider about:  Any allergies you have.  All medicines you are taking, including vitamins, herbs, eye drops, creams, and over-the-counter medicines.  Any problems you or family members have had with anesthetic medicines.  Any blood disorders you have.  Any surgeries you have had.  Any medical conditions you have.  Whether you are pregnant or may be pregnant. What are the risks? Generally, this is a safe procedure. However, problems may occur, including:  Infection.  Bleeding.  Allergic reactions to medicines.  Damage to other structures or organs.  A stone remaining in the common bile duct. The common bile duct carries bile from the gallbladder into the small intestine.  A bile leak from the cyst duct that is clipped when your gallbladder is removed.    Medicines  Ask your health care provider about: ? Changing or stopping your regular medicines. This is especially important if you are taking diabetes medicines or blood  thinners. ? Taking medicines such as aspirin and ibuprofen. These medicines can thin your blood. Do not take these medicines before your procedure if your health care provider instructs you not to.  You may be given antibiotic medicine to help prevent infection. General instructions  Let your health care provider know if you develop a cold or an infection before surgery.  Plan to have someone take you home from the hospital or clinic.  Ask your health care provider how your surgical site will be marked or identified. What happens during the procedure?  To reduce your risk of infection: ? Your health care team will wash or sanitize their hands. ? Your skin will be washed with soap. ? Hair may be removed from the surgical area.  An IV tube may be inserted into one of your veins.  You will be given one or more of the following: ? A medicine to help you relax (sedative). ? A medicine to make you fall asleep (general anesthetic).  A breathing tube will be placed in your mouth.  Your surgeon will make several small cuts (incisions) in your abdomen.  The laparoscope will be inserted through one of the small incisions. The camera on the laparoscope will send images to a TV screen (monitor) in the operating room. This lets your surgeon see inside your abdomen.  Air-like gas will be pumped into your abdomen. This will expand your abdomen to give the surgeon more room to perform the surgery.  Other tools that are needed for the procedure will be inserted through the other incisions. The gallbladder will be removed through one of the incisions.  Your common bile duct may be examined. If stones   are found in the common bile duct, they may be removed.  After your gallbladder has been removed, the incisions will be closed with stitches (sutures), staples, or skin glue.  Your incisions may be covered with a bandage (dressing). The procedure may vary among health care providers and  hospitals. What happens after the procedure?  Your blood pressure, heart rate, breathing rate, and blood oxygen level will be monitored until the medicines you were given have worn off.  You will be given medicines as needed to control your pain.  Do not drive for 24 hours if you were given a sedative. This information is not intended to replace advice given to you by your health care provider. Make sure you discuss any questions you have with your health care provider. Document Released: 02/03/2005 Document Revised: 08/26/2015 Document Reviewed: 07/23/2015 Elsevier Interactive Patient Education  2018 [employer] Inc.  

## 2018-01-22 ENCOUNTER — Encounter (HOSPITAL_COMMUNITY)
Admission: RE | Admit: 2018-01-22 | Discharge: 2018-01-22 | Disposition: A | Discharge status: Home / Self Care | Payer: Self-pay | Source: Ambulatory Visit | Attending: General Surgery | Admitting: General Surgery

## 2018-01-25 ENCOUNTER — Encounter (HOSPITAL_COMMUNITY)
Admission: RE | Disposition: A | Discharge status: Home / Self Care | Payer: Self-pay | Source: Home / Self Care | Attending: General Surgery

## 2018-01-25 ENCOUNTER — Ambulatory Visit (HOSPITAL_COMMUNITY)
Admission: RE | Admit: 2018-01-25 | Discharge: 2018-01-25 | Disposition: A | Discharge status: Home / Self Care | Payer: Self-pay | Attending: General Surgery | Admitting: General Surgery

## 2018-01-25 ENCOUNTER — Encounter (HOSPITAL_COMMUNITY): Payer: Self-pay

## 2018-01-25 ENCOUNTER — Ambulatory Visit (HOSPITAL_COMMUNITY): Payer: Self-pay | Admitting: Anesthesiology

## 2018-01-25 DIAGNOSIS — K8020 Calculus of gallbladder without cholecystitis without obstruction: Secondary | ICD-10-CM

## 2018-01-25 DIAGNOSIS — F17210 Nicotine dependence, cigarettes, uncomplicated: Secondary | ICD-10-CM | POA: Insufficient documentation

## 2018-01-25 DIAGNOSIS — Z888 Allergy status to other drugs, medicaments and biological substances status: Secondary | ICD-10-CM | POA: Insufficient documentation

## 2018-01-25 DIAGNOSIS — N200 Calculus of kidney: Secondary | ICD-10-CM | POA: Insufficient documentation

## 2018-01-25 DIAGNOSIS — B1920 Unspecified viral hepatitis C without hepatic coma: Secondary | ICD-10-CM | POA: Insufficient documentation

## 2018-01-25 DIAGNOSIS — F419 Anxiety disorder, unspecified: Secondary | ICD-10-CM | POA: Insufficient documentation

## 2018-01-25 DIAGNOSIS — Z79899 Other long term (current) drug therapy: Secondary | ICD-10-CM | POA: Insufficient documentation

## 2018-01-25 DIAGNOSIS — K8064 Calculus of gallbladder and bile duct with chronic cholecystitis without obstruction: Secondary | ICD-10-CM | POA: Insufficient documentation

## 2018-01-25 DIAGNOSIS — Z8619 Personal history of other infectious and parasitic diseases: Secondary | ICD-10-CM | POA: Insufficient documentation

## 2018-01-25 DIAGNOSIS — R768 Other specified abnormal immunological findings in serum: Secondary | ICD-10-CM

## 2018-01-25 HISTORY — PX: CHOLECYSTECTOMY: SHX55

## 2018-01-25 HISTORY — PX: LIVER BIOPSY: SHX301

## 2018-01-25 LAB — HCG, SERUM, QUALITATIVE: Preg, Serum: NEGATIVE

## 2018-01-25 SURGERY — LAPAROSCOPIC CHOLECYSTECTOMY
Anesthesia: General

## 2018-01-25 MED ORDER — CHLORHEXIDINE GLUCONATE CLOTH 2 % EX PADS: 6.0000 | MEDICATED_PAD | Freq: Once | CUTANEOUS | Status: DC

## 2018-01-25 MED ORDER — POVIDONE-IODINE 10 % OINT PACKET
TOPICAL_OINTMENT | CUTANEOUS | Status: DC | PRN
  Administered 2018-01-25: 1 via TOPICAL

## 2018-01-25 MED ORDER — KETOROLAC TROMETHAMINE 30 MG/ML IJ SOLN
30.0000 mg | Freq: Once | INTRAMUSCULAR | Status: AC
  Administered 2018-01-25: 30 mg via INTRAVENOUS

## 2018-01-25 MED ORDER — BUPIVACAINE LIPOSOME 1.3 % IJ SUSP
INTRAMUSCULAR | Status: DC | PRN
  Administered 2018-01-25: 20 mL

## 2018-01-25 MED ORDER — DEXAMETHASONE SODIUM PHOSPHATE 4 MG/ML IJ SOLN
INTRAMUSCULAR | Status: DC | PRN
  Administered 2018-01-25: 8 mg via INTRAVENOUS

## 2018-01-25 MED ORDER — POVIDONE-IODINE 10 % EX OINT
TOPICAL_OINTMENT | CUTANEOUS | Status: AC
  Filled 2018-01-25: qty 1

## 2018-01-25 MED ORDER — HEMOSTATIC AGENTS (NO CHARGE) OPTIME
TOPICAL | Status: DC | PRN
  Administered 2018-01-25: 1 via TOPICAL

## 2018-01-25 MED ORDER — BUPIVACAINE LIPOSOME 1.3 % IJ SUSP
INTRAMUSCULAR | Status: AC
  Filled 2018-01-25: qty 20

## 2018-01-25 MED ORDER — SODIUM CHLORIDE 0.9 % IR SOLN
Status: DC | PRN
  Administered 2018-01-25: 1000 mL

## 2018-01-25 MED ORDER — FENTANYL CITRATE (PF) 100 MCG/2ML IJ SOLN
INTRAMUSCULAR | Status: DC | PRN
  Administered 2018-01-25 (×5): 50 ug via INTRAVENOUS

## 2018-01-25 MED ORDER — MIDAZOLAM HCL 2 MG/2ML IJ SOLN
INTRAMUSCULAR | Status: AC
  Filled 2018-01-25: qty 2

## 2018-01-25 MED ORDER — LIDOCAINE 2% (20 MG/ML) 5 ML SYRINGE
INTRAMUSCULAR | Status: AC
  Filled 2018-01-25: qty 5

## 2018-01-25 MED ORDER — SUCCINYLCHOLINE CHLORIDE 20 MG/ML IJ SOLN
INTRAMUSCULAR | Status: DC | PRN
  Administered 2018-01-25: 120 mg via INTRAVENOUS

## 2018-01-25 MED ORDER — LIDOCAINE HCL (PF) 1 % IJ SOLN
INTRAMUSCULAR | Status: AC
  Filled 2018-01-25: qty 2

## 2018-01-25 MED ORDER — ROCURONIUM BROMIDE 50 MG/5ML IV SOSY
PREFILLED_SYRINGE | INTRAVENOUS | Status: DC | PRN
  Administered 2018-01-25: 30 mg via INTRAVENOUS

## 2018-01-25 MED ORDER — GLYCOPYRROLATE PF 0.2 MG/ML IJ SOSY
PREFILLED_SYRINGE | INTRAMUSCULAR | Status: DC | PRN
  Administered 2018-01-25: .2 mg via INTRAVENOUS

## 2018-01-25 MED ORDER — KETOROLAC TROMETHAMINE 30 MG/ML IJ SOLN
INTRAMUSCULAR | Status: AC
  Filled 2018-01-25: qty 1

## 2018-01-25 MED ORDER — HYDROMORPHONE HCL 1 MG/ML IJ SOLN
0.2500 mg | INTRAMUSCULAR | Status: DC | PRN
  Administered 2018-01-25 (×4): 0.5 mg via INTRAVENOUS
  Filled 2018-01-25 (×4): qty 0.5

## 2018-01-25 MED ORDER — MIDAZOLAM HCL 2 MG/2ML IJ SOLN: 0.5000 mg | Freq: Once | INTRAMUSCULAR | Status: DC | PRN

## 2018-01-25 MED ORDER — CIPROFLOXACIN IN D5W 400 MG/200ML IV SOLN
400.0000 mg | INTRAVENOUS | Status: AC
  Administered 2018-01-25: 400 mg via INTRAVENOUS
  Filled 2018-01-25: qty 200

## 2018-01-25 MED ORDER — ONDANSETRON HCL 4 MG/2ML IJ SOLN
INTRAMUSCULAR | Status: DC | PRN
  Administered 2018-01-25: 4 mg via INTRAVENOUS

## 2018-01-25 MED ORDER — LIDOCAINE 2% (20 MG/ML) 5 ML SYRINGE
INTRAMUSCULAR | Status: DC | PRN
  Administered 2018-01-25: 40 mg via INTRAVENOUS

## 2018-01-25 MED ORDER — ROCURONIUM BROMIDE 10 MG/ML (PF) SYRINGE
PREFILLED_SYRINGE | INTRAVENOUS | Status: AC
  Filled 2018-01-25: qty 10

## 2018-01-25 MED ORDER — PROMETHAZINE HCL 25 MG/ML IJ SOLN: 6.2500 mg | INTRAMUSCULAR | Status: DC | PRN

## 2018-01-25 MED ORDER — OXYCODONE-ACETAMINOPHEN 7.5-325 MG PO TABS: 1.0000 | ORAL_TABLET | Freq: Four times a day (QID) | ORAL | 0 refills | Status: DC | PRN

## 2018-01-25 MED ORDER — LACTATED RINGERS IV SOLN
INTRAVENOUS | Status: DC
  Administered 2018-01-25: 12:00:00 via INTRAVENOUS

## 2018-01-25 MED ORDER — GLYCOPYRROLATE PF 0.2 MG/ML IJ SOSY
PREFILLED_SYRINGE | INTRAMUSCULAR | Status: AC
  Filled 2018-01-25: qty 1

## 2018-01-25 MED ORDER — MIDAZOLAM HCL 5 MG/5ML IJ SOLN
INTRAMUSCULAR | Status: DC | PRN
  Administered 2018-01-25: 2 mg via INTRAVENOUS

## 2018-01-25 MED ORDER — HYDROCODONE-ACETAMINOPHEN 7.5-325 MG PO TABS
1.0000 | ORAL_TABLET | Freq: Once | ORAL | Status: AC | PRN
  Administered 2018-01-25: 1 via ORAL
  Filled 2018-01-25: qty 1

## 2018-01-25 MED ORDER — FENTANYL CITRATE (PF) 250 MCG/5ML IJ SOLN
INTRAMUSCULAR | Status: AC
  Filled 2018-01-25: qty 5

## 2018-01-25 MED ORDER — SUCCINYLCHOLINE CHLORIDE 200 MG/10ML IV SOSY
PREFILLED_SYRINGE | INTRAVENOUS | Status: AC
  Filled 2018-01-25: qty 10

## 2018-01-25 MED ORDER — PROPOFOL 10 MG/ML IV BOLUS
INTRAVENOUS | Status: AC
  Filled 2018-01-25: qty 20

## 2018-01-25 SURGICAL SUPPLY — 66 items
APPLIER CLIP ROT 10 11.4 M/L (STAPLE) ×3
APR CLP MED LRG 11.4X10 (STAPLE) ×1
BAG RETRIEVAL 10 (BASKET) ×1
BAG RETRIEVAL 10MM (BASKET) ×1
CHLORAPREP W/TINT 26ML (MISCELLANEOUS) ×3 IMPLANT
CLIP APPLIE ROT 10 11.4 M/L (STAPLE) ×1 IMPLANT
CLOSURE WOUND 1/4 X3 (GAUZE/BANDAGES/DRESSINGS) ×1
CLOTH BEACON ORANGE TIMEOUT ST (SAFETY) ×3 IMPLANT
COVER LIGHT HANDLE STERIS (MISCELLANEOUS) ×6 IMPLANT
COVER WAND RF STERILE (DRAPES) ×2 IMPLANT
DURAPREP 26ML APPLICATOR (WOUND CARE) ×3 IMPLANT
ELECT REM PT RETURN 9FT ADLT (ELECTROSURGICAL) ×3
ELECTRODE REM PT RTRN 9FT ADLT (ELECTROSURGICAL) ×1 IMPLANT
FILTER SMOKE EVAC LAPAROSHD (FILTER) ×3 IMPLANT
GLOVE BIO SURGEON STRL SZ 6.5 (GLOVE) ×1 IMPLANT
GLOVE BIO SURGEON STRL SZ7 (GLOVE) ×2 IMPLANT
GLOVE BIO SURGEONS STRL SZ 6.5 (GLOVE) ×1
GLOVE BIOGEL PI IND STRL 6.5 (GLOVE) IMPLANT
GLOVE BIOGEL PI IND STRL 7.0 (GLOVE) ×1 IMPLANT
GLOVE BIOGEL PI INDICATOR 6.5 (GLOVE) ×2
GLOVE BIOGEL PI INDICATOR 7.0 (GLOVE) ×4
GLOVE SURG SS PI 7.5 STRL IVOR (GLOVE) ×6 IMPLANT
GOWN STRL REUS W/ TWL XL LVL3 (GOWN DISPOSABLE) ×1 IMPLANT
GOWN STRL REUS W/TWL LRG LVL3 (GOWN DISPOSABLE) ×9 IMPLANT
GOWN STRL REUS W/TWL XL LVL3 (GOWN DISPOSABLE) ×3
HEMOSTAT SNOW SURGICEL 2X4 (HEMOSTASIS) ×3 IMPLANT
INST SET LAPROSCOPIC AP (KITS) ×3 IMPLANT
KIT TURNOVER KIT A (KITS) ×3 IMPLANT
MANIFOLD NEPTUNE II (INSTRUMENTS) ×3 IMPLANT
NDL BIOPSY 14GX4.5 SOFT TIS (NEEDLE) IMPLANT
NDL BIOPSY 14X6 SOFT TISS (NEEDLE) IMPLANT
NDL HYPO 18GX1.5 BLUNT FILL (NEEDLE) ×1 IMPLANT
NDL HYPO 25X1 1.5 SAFETY (NEEDLE) ×1 IMPLANT
NDL INSUFFLATION 14GA 120MM (NEEDLE) ×1 IMPLANT
NEEDLE BIOPSY 14GX4.5 SOFT TIS (NEEDLE) ×3 IMPLANT
NEEDLE BIOPSY 14X6 SOFT TISS (NEEDLE) ×3 IMPLANT
NEEDLE HYPO 18GX1.5 BLUNT FILL (NEEDLE) ×3 IMPLANT
NEEDLE HYPO 22GX1.5 SAFETY (NEEDLE) ×3 IMPLANT
NEEDLE HYPO 25X1 1.5 SAFETY (NEEDLE) ×3 IMPLANT
NEEDLE INSUFFLATION 14GA 120MM (NEEDLE) ×3 IMPLANT
NS IRRIG 1000ML POUR BTL (IV SOLUTION) ×3 IMPLANT
PACK LAP CHOLE LZT030E (CUSTOM PROCEDURE TRAY) ×3 IMPLANT
PACK MINOR (CUSTOM PROCEDURE TRAY) ×3 IMPLANT
PAD ARMBOARD 7.5X6 YLW CONV (MISCELLANEOUS) ×3 IMPLANT
PAD TELFA 3X4 1S STER (GAUZE/BANDAGES/DRESSINGS) ×3 IMPLANT
SET BASIN LINEN APH (SET/KITS/TRAYS/PACK) ×3 IMPLANT
SLEEVE ENDOPATH XCEL 5M (ENDOMECHANICALS) ×3 IMPLANT
SPONGE GAUZE 2X2 8PLY STER LF (GAUZE/BANDAGES/DRESSINGS) ×4
SPONGE GAUZE 2X2 8PLY STRL LF (GAUZE/BANDAGES/DRESSINGS) ×8 IMPLANT
STAPLER VISISTAT (STAPLE) ×3 IMPLANT
STRIP CLOSURE SKIN 1/4X3 (GAUZE/BANDAGES/DRESSINGS) ×2 IMPLANT
SUT MNCRL AB 4-0 PS2 18 (SUTURE) ×3 IMPLANT
SUT VICRYL 0 UR6 27IN ABS (SUTURE) ×3 IMPLANT
SYR 10ML LL (SYRINGE) ×3 IMPLANT
SYR 20CC LL (SYRINGE) ×3 IMPLANT
SYS BAG RETRIEVAL 10MM (BASKET) ×1
SYSTEM BAG RETRIEVAL 10MM (BASKET) ×1 IMPLANT
TAPE CLOTH SURG 4X10 WHT LF (GAUZE/BANDAGES/DRESSINGS) ×2 IMPLANT
TOWEL OR 17X26 4PK STRL BLUE (TOWEL DISPOSABLE) ×3 IMPLANT
TROCAR ENDO BLADELESS 11MM (ENDOMECHANICALS) ×3 IMPLANT
TROCAR XCEL NON-BLD 5MMX100MML (ENDOMECHANICALS) ×3 IMPLANT
TROCAR XCEL UNIV SLVE 11M 100M (ENDOMECHANICALS) ×3 IMPLANT
TUBE CONNECTING 12'X1/4 (SUCTIONS) ×1
TUBE CONNECTING 12X1/4 (SUCTIONS) ×2 IMPLANT
TUBING INSUFFLATION (TUBING) ×3 IMPLANT
WARMER LAPAROSCOPE (MISCELLANEOUS) ×3 IMPLANT

## 2018-01-25 NOTE — Anesthesia Postprocedure Evaluation (Signed)
Anesthesia Post Note  Patient: Kennon PortelaChristina L Flannagan  Procedure(s) Performed: LAPAROSCOPIC CHOLECYSTECTOMY (N/A ) LIVER BIOPSY (N/A )  Patient location during evaluation: PACU Anesthesia Type: General Level of consciousness: awake and alert and oriented Pain management: pain level controlled Vital Signs Assessment: post-procedure vital signs reviewed and stable Respiratory status: spontaneous breathing Cardiovascular status: stable Postop Assessment: no apparent nausea or vomiting Anesthetic complications: no     Last Vitals:  Vitals:   01/25/18 1003 01/25/18 1324  BP: 111/64   Pulse: 77   Resp: 16   Temp: 36.5 C 36.8 C  SpO2: 97%     Last Pain:  Vitals:   01/25/18 1330  TempSrc:   PainSc: 10-Worst pain ever                 Griffen Frayne A

## 2018-01-25 NOTE — Discharge Instructions (Signed)
Laparoscopic Cholecystectomy, Care After °This sheet gives you information about how to care for yourself after your procedure. Your health care provider may also give you more specific instructions. If you have problems or questions, contact your health care provider. °What can I expect after the procedure? °After the procedure, it is common to have: °· Pain at your incision sites. You will be given medicines to control this pain. °· Mild nausea or vomiting. °· Bloating and possible shoulder pain from the air-like gas that was used during the procedure. ° °Follow these instructions at home: °Incision care ° °· Follow instructions from your health care provider about how to take care of your incisions. Make sure you: °? Wash your hands with soap and water before you change your bandage (dressing). If soap and water are not available, use hand sanitizer. °? Change your dressing as told by your health care provider. °? Leave stitches (sutures), skin glue, or adhesive strips in place. These skin closures may need to be in place for 2 weeks or longer. If adhesive strip edges start to loosen and curl up, you may trim the loose edges. Do not remove adhesive strips completely unless your health care provider tells you to do that. °· Do not take baths, swim, or use a hot tub until your health care provider approves. Ask your health care provider if you can take showers. You may only be allowed to take sponge baths for bathing. °· Check your incision area every day for signs of infection. Check for: °? More redness, swelling, or pain. °? More fluid or blood. °? Warmth. °? Pus or a bad smell. °Activity °· Do not drive or use heavy machinery while taking prescription pain medicine. °· Do not lift anything that is heavier than 10 lb (4.5 kg) until your health care provider approves. °· Do not play contact sports until your health care provider approves. °· Do not drive for 24 hours if you were given a medicine to help you relax  (sedative). °· Rest as needed. Do not return to work or school until your health care provider approves. °General instructions °· Take over-the-counter and prescription medicines only as told by your health care provider. °· To prevent or treat constipation while you are taking prescription pain medicine, your health care provider may recommend that you: °? Drink enough fluid to keep your urine clear or pale yellow. °? Take over-the-counter or prescription medicines. °? Eat foods that are high in fiber, such as fresh fruits and vegetables, whole grains, and beans. °? Limit foods that are high in fat and processed sugars, such as fried and sweet foods. °Contact a health care provider if: °· You develop a rash. °· You have more redness, swelling, or pain around your incisions. °· You have more fluid or blood coming from your incisions. °· Your incisions feel warm to the touch. °· You have pus or a bad smell coming from your incisions. °· You have a fever. °· One or more of your incisions breaks open. °Get help right away if: °· You have trouble breathing. °· You have chest pain. °· You have increasing pain in your shoulders. °· You faint or feel dizzy when you stand. °· You have severe pain in your abdomen. °· You have nausea or vomiting that lasts for more than one day. °· You have leg pain. °This information is not intended to replace advice given to you by your health care provider. Make sure you discuss any questions you   have with your health care provider. Document Released: 02/03/2005 Document Revised: 08/25/2015 Document Reviewed: 07/23/2015 Elsevier Interactive Patient Education  2018 [employer]    General Anesthesia, Adult, Care After These instructions provide you with information about caring for yourself after your procedure. Your health care provider may also give you more specific instructions. Your treatment has been planned according to current medical practices, but problems sometimes  occur. Call your health care provider if you have any problems or questions after your procedure. What can I expect after the procedure? After the procedure, it is common to have:  Vomiting.  A sore throat.  Mental slowness.  It is common to feel:  Nauseous.  Cold or shivery.  Sleepy.  Tired.  Sore or achy, even in parts of your body where you did not have surgery.  Follow these instructions at home: For at least 24 hours after the procedure:  Do not: ? Participate in activities where you could fall or become injured. ? Drive. ? Use heavy machinery. ? Drink alcohol. ? Take sleeping pills or medicines that cause drowsiness. ? Make important decisions or sign legal documents. ? Take care of children on your own.  Rest. Eating and drinking  If you vomit, drink water, juice, or soup when you can drink without vomiting.  Drink enough fluid to keep your urine clear or pale yellow.  Make sure you have little or no nausea before eating solid foods.  Follow the diet recommended by your health care provider. General instructions  Have a responsible adult stay with you until you are awake and alert.  Return to your normal activities as told by your health care provider. Ask your health care provider what activities are safe for you.  Take over-the-counter and prescription medicines only as told by your health care provider.  If you smoke, do not smoke without supervision.  Keep all follow-up visits as told by your health care provider. This is important. Contact a health care provider if:  You continue to have nausea or vomiting at home, and medicines are not helpful.  You cannot drink fluids or start eating again.  You cannot urinate after 8-12 hours.  You develop a skin rash.  You have fever.  You have increasing redness at the site of your procedure. Get help right away if:  You have difficulty breathing.  You have chest pain.  You have unexpected  bleeding.  You feel that you are having a life-threatening or urgent problem. This information is not intended to replace advice given to you by your health care provider. Make sure you discuss any questions you have with your health care provider. Document Released: 05/12/2000 Document Revised: 07/09/2015 Document Reviewed: 01/18/2015 Elsevier Interactive Patient Education  2018 [employer] Inc[employer]    Bupivacaine Liposomal Suspension for Injection What is this medicine? BUPIVACAINE LIPOSOMAL (bue PIV a kane LIP oh som al) is an anesthetic. It causes loss of feeling in the skin or other tissues. It is used to prevent and to treat pain from some procedures. This medicine may be used for other purposes; ask your health care provider or pharmacist if you have questions. COMMON BRAND NAME(S): EXPAREL What should I tell my health care provider before I take this medicine? They need to know if you have any of these conditions: -heart disease -kidney disease -liver disease -an unusual or allergic reaction to bupivacaine, other medicines, foods, dyes, or preservatives -pregnant or trying to get pregnant -breast-feeding How should I use this medicine?  This medicine is for injection into the affected area. It is given by a health care professional in a hospital or clinic setting. Talk to your pediatrician regarding the use of this medicine in children. Special care may be needed. Overdosage: If you think you have taken too much of this medicine contact a poison control center or emergency room at once. NOTE: This medicine is only for you. Do not share this medicine with others. What if I miss a dose? This does not apply. What may interact with this medicine? -other anesthetics This list may not describe all possible interactions. Give your health care provider a list of all the medicines, herbs, non-prescription drugs, or dietary supplements you use. Also tell them if you smoke, drink alcohol, or use  illegal drugs. Some items may interact with your medicine. What should I watch for while using this medicine? Your condition will be monitored carefully while you are receiving this medicine. Be careful to avoid injury while the area is numb and you are not aware of pain. Do not use another medicine that contains bupivacaine for 96 hours after receiving bupivacaine liposomal. You may have more side effects. Talk to your doctor if you have questions. What side effects may I notice from receiving this medicine? Side effects that you should report to your doctor or health care professional as soon as possible: -allergic reactions like skin rash, itching or hives, swelling of the face, lips, or tongue -breathing problems -changes in vision -dizziness -fast or slow, irregular heartbeat -joint pain, stiffness, or loss of motion -seizures Side effects that usually do not require medical attention (report to your doctor or health care professional if they continue or are bothersome): -constipation -irritation at site where injected -nausea, vomiting -tiredness This list may not describe all possible side effects. Call your doctor for medical advice about side effects. You may report side effects to FDA at 1-800-FDA-1088. Where should I keep my medicine? This drug is given in a hospital or clinic and will not be stored at home. NOTE: This sheet is a summary. It may not cover all possible information. If you have questions about this medicine, talk to your doctor, pharmacist, or health care provider.  2018 Elsevier/Gold Standard (2015-03-08 09:03:52)

## 2018-01-25 NOTE — Anesthesia Preprocedure Evaluation (Signed)
Anesthesia Evaluation  Patient identified by MRN, date of birth, ID band Patient awake    Reviewed: Allergy & Precautions, NPO status , Patient's Chart, lab work & pertinent test results  Airway Mallampati: I  TM Distance: >3 FB Neck ROM: Full    Dental no notable dental hx. 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  States + Hep C - awaiting Txnegative neurological ROS  negative psych ROS   GI/Hepatic negative GI ROS, Neg liver ROS,   Endo/Other  negative endocrine ROS  Renal/GU negative Renal ROS  negative genitourinary   Musculoskeletal negative musculoskeletal ROS (+)   Abdominal   Peds negative pediatric ROS (+)  Hematology negative hematology ROS (+)   Anesthesia Other Findings   Reproductive/Obstetrics negative OB ROS                             Anesthesia Physical Anesthesia Plan  ASA: III  Anesthesia Plan: General   Post-op Pain Management:    Induction: Intravenous  PONV Risk Score and Plan:   Airway Management Planned: Oral ETT  Additional Equipment:   Intra-op Plan:   Post-operative Plan: Extubation in OR  Informed Consent: I have reviewed the patients History and Physical, chart, labs and discussed the procedure including the risks, benefits and alternatives for the proposed anesthesia with the patient or authorized representative who has indicated his/her understanding and acceptance.   Dental advisory given  Plan Discussed with: CRNA  Anesthesia Plan Comments:         Anesthesia Quick Evaluation

## 2018-01-25 NOTE — Progress Notes (Signed)
Patient had nausea in PACU and concerned about having nausea at home. Dr. Lovell SheehanJenkins notified. Verbal order for Zofran 4mg  PO q6hours prn nausea, 20 tablets [facility] [place], [place]

## 2018-01-25 NOTE — Op Note (Signed)
Patient:  Kendra PortelaChristina L Ho  DOB:  [birth year]  MRN:  [id number]   Preop Diagnosis: Biliary colic, history of hepatitis C  Postop Diagnosis: Same, cholelithiasis  Procedure: Laparoscopic cholecystectomy, liver biopsy  Surgeon: Franky MachoMark Hiba Garry, MD  Assistant: Larae GroomsLindsey Bridges, MD  Anes: General endotracheal  Indications: Patient is a [age] year old white female with a history of hepatitis C who presents with biliary colic.  The risks and benefits of the procedure including bleeding, infection, hepatobiliary injury, and the possibility of an open procedure were fully explained to the patient, who gave informed consent.  Procedure note: The patient was placed in the supine position.  After induction of general endotracheal anesthesia, the abdomen was prepped and draped using the usual sterile technique with DuraPrep.  Surgical site confirmation was performed.  A supraumbilical incision was made down to the fascia.  A Veress needle was introduced into the abdominal cavity and confirmation of placement was done using saline drop test.  The abdomen was then insufflated to 16 mmHg pressure.  An 11 mm trocar was introduced into the abdominal cavity under direct visualization without difficulty.  The patient was placed in reverse Trendelenburg position and an additional low millimeter trocar was placed in the epigastric region.  A Tru-Cut needle biopsy of the right lobe of the liver was performed x2.  The specimen was sent to pathology for further examination.  A bleeding from the biopsy site was cauterized using Bovie electrocautery.  5 mm trochars were placed then in the right upper quadrant and right flank regions.  The liver was inspected and noted to be within normal limits.  The gallbladder was retracted in a dynamic fashion in order to provide a critical view of the triangle of Calot.  The cystic duct was first identified.  Its juncture to the infundibulum was fully identified.  Endoclips were placed  proximally and distally on the cystic duct, and the cystic duct was divided.  This was likewise done with the cystic artery.  The gallbladder was freed away from the gallbladder fossa using Bovie electrocautery.  The gallbladder was delivered through the epigastric trocar site using an Endo Catch bag.  The gallbladder fossa was inspected and no abnormal bleeding or bile leakage was noted.  Surgicel was placed the gallbladder fossa.  All fluid and air were then evacuated from the abdominal cavity prior to removal of the trochars.  All wounds were irrigated with normal saline.  All wounds were injected with Exparel.  The incisions were closed using staples.  Betadine ointment and dry sterile dressings were applied.  All tape and needle counts were correct at the end of the procedure.  The patient was extubated in the operating room and transferred to PACU in stable condition.  Complications: None  EBL: Minimal  Specimen: Gallbladder, liver biopsy

## 2018-01-25 NOTE — Interval H&P Note (Signed)
History and Physical Interval Note:  01/25/2018 11:35 AM  Kendra Ho  has presented today for surgery, with the diagnosis of biliary sludge  The various methods of treatment have been discussed with the patient and family. After consideration of risks, benefits and other options for treatment, the patient has consented to  Procedure(s): LAPAROSCOPIC CHOLECYSTECTOMY (N/A) LIVER BIOPSY (N/A) as a surgical intervention .  The patient's history has been reviewed, patient examined, no change in status, stable for surgery.  I have reviewed the patient's chart and labs.  Questions were answered to the patient's satisfaction.     Franky MachoMark Kasean Denherder

## 2018-01-25 NOTE — Anesthesia Procedure Notes (Addendum)
Procedure Name: Intubation Date/Time: 01/25/2018 12:40 PM Performed by: Pernell DupreAdams, Amy A, CRNA Pre-anesthesia Checklist: Patient identified, Patient being monitored, Timeout performed, Emergency Drugs available and Suction available Patient Re-evaluated:Patient Re-evaluated prior to induction Oxygen Delivery Method: Circle system utilized Preoxygenation: Pre-oxygenation with 100% oxygen Induction Type: IV induction Ventilation: Mask ventilation without difficulty Laryngoscope Size: 3 and Miller Grade View: Grade I Tube type: Oral Tube size: 7.0 mm Number of attempts: 2 Airway Equipment and Method: Stylet Placement Confirmation: ETT inserted through vocal cords under direct vision,  positive ETCO2 and breath sounds checked- equal and bilateral Secured at: 21 cm Tube secured with: Tape Dental Injury: Teeth and Oropharynx as per pre-operative assessment

## 2018-01-25 NOTE — Transfer of Care (Signed)
Immediate Anesthesia Transfer of Care Note  Patient: Kendra Ho  Procedure(s) Performed: LAPAROSCOPIC CHOLECYSTECTOMY (N/A ) LIVER BIOPSY (N/A )  Patient Location: PACU  Anesthesia Type:General  Level of Consciousness: awake, alert , oriented and patient cooperative  Airway & Oxygen Therapy: Patient Spontanous Breathing  Post-op Assessment: Report given to RN and Post -op Vital signs reviewed and stable  Post vital signs: Reviewed and stable  Last Vitals:  Vitals Value Taken Time  BP 105/69 01/25/2018  1:24 PM  Temp    Pulse 95 01/25/2018  1:26 PM  Resp 23 01/25/2018  1:26 PM  SpO2 93 % 01/25/2018  1:26 PM  Vitals shown include unvalidated device data.  Last Pain:  Vitals:   01/25/18 1003  TempSrc: Oral  PainSc: 0-No pain      Patients Stated Pain Goal: 4 (01/25/18 1003)  Complications: No apparent anesthesia complications

## 2018-01-26 ENCOUNTER — Encounter (HOSPITAL_COMMUNITY): Payer: Self-pay | Admitting: General Surgery

## 2018-01-26 ENCOUNTER — Telehealth: Payer: Self-pay | Admitting: Emergency Medicine

## 2018-01-26 NOTE — Telephone Encounter (Signed)
Patient called in and stated she needed her medication [facility]. [facility], she stated [facility] refuses to take transferees from other pharmacies. After speaking to the [facility]ist at [facility] she stated that she will not contact [facility] to tranfer the rx so I gave her a verbal for zofran 4mg  to take prn, notified provider and pt

## 2018-01-31 ENCOUNTER — Emergency Department (HOSPITAL_COMMUNITY): Payer: Self-pay

## 2018-01-31 ENCOUNTER — Other Ambulatory Visit: Payer: Self-pay

## 2018-01-31 ENCOUNTER — Emergency Department (HOSPITAL_COMMUNITY)
Admission: EM | Admit: 2018-01-31 | Discharge: 2018-01-31 | Disposition: A | Discharge status: Home / Self Care | Payer: Self-pay | Attending: Emergency Medicine | Admitting: Emergency Medicine

## 2018-01-31 ENCOUNTER — Encounter (HOSPITAL_COMMUNITY): Payer: Self-pay | Admitting: *Deleted

## 2018-01-31 DIAGNOSIS — F17210 Nicotine dependence, cigarettes, uncomplicated: Secondary | ICD-10-CM | POA: Insufficient documentation

## 2018-01-31 DIAGNOSIS — Z79899 Other long term (current) drug therapy: Secondary | ICD-10-CM | POA: Insufficient documentation

## 2018-01-31 DIAGNOSIS — R109 Unspecified abdominal pain: Secondary | ICD-10-CM

## 2018-01-31 DIAGNOSIS — R1013 Epigastric pain: Secondary | ICD-10-CM | POA: Insufficient documentation

## 2018-01-31 DIAGNOSIS — G8918 Other acute postprocedural pain: Secondary | ICD-10-CM | POA: Insufficient documentation

## 2018-01-31 LAB — CBC WITH DIFFERENTIAL/PLATELET
Abs Immature Granulocytes: 0.04 10*3/uL (ref 0.00–0.07)
Basophils Absolute: 0.1 10*3/uL (ref 0.0–0.1)
Basophils Relative: 1 %
Eosinophils Absolute: 0.2 10*3/uL (ref 0.0–0.5)
Eosinophils Relative: 2 %
HCT: 41.5 % (ref 36.0–46.0)
HEMOGLOBIN: 13.9 g/dL (ref 12.0–15.0)
Immature Granulocytes: 0 %
Lymphocytes Relative: 30 %
Lymphs Abs: 3 10*3/uL (ref 0.7–4.0)
MCH: 30.3 pg (ref 26.0–34.0)
MCHC: 33.5 g/dL (ref 30.0–36.0)
MCV: 90.6 fL (ref 80.0–100.0)
Monocytes Absolute: 0.6 10*3/uL (ref 0.1–1.0)
Monocytes Relative: 6 %
Neutro Abs: 6.2 10*3/uL (ref 1.7–7.7)
Neutrophils Relative %: 61 %
Platelets: 373 10*3/uL (ref 150–400)
RBC: 4.58 MIL/uL (ref 3.87–5.11)
RDW: 12.7 % (ref 11.5–15.5)
WBC: 10 10*3/uL (ref 4.0–10.5)
nRBC: 0 % (ref 0.0–0.2)

## 2018-01-31 LAB — URINALYSIS, ROUTINE W REFLEX MICROSCOPIC
Bilirubin Urine: NEGATIVE
Glucose, UA: NEGATIVE mg/dL
Hgb urine dipstick: NEGATIVE
Ketones, ur: NEGATIVE mg/dL
Leukocytes, UA: NEGATIVE
NITRITE: NEGATIVE
Protein, ur: NEGATIVE mg/dL
Specific Gravity, Urine: >1.046 — ABNORMAL HIGH (ref 1.005–1.030)
pH: 7 (ref 5.0–8.0)

## 2018-01-31 LAB — COMPREHENSIVE METABOLIC PANEL
ALK PHOS: 85 U/L (ref 38–126)
ALT: 57 U/L — ABNORMAL HIGH (ref 0–44)
AST: 48 U/L — ABNORMAL HIGH (ref 15–41)
Albumin: 3.7 g/dL (ref 3.5–5.0)
Anion gap: 9 (ref 5–15)
BUN: 14 mg/dL (ref 6–20)
CO2: 20 mmol/L — AB (ref 22–32)
Calcium: 9.1 mg/dL (ref 8.9–10.3)
Chloride: 108 mmol/L (ref 98–111)
Creatinine, Ser: 0.63 mg/dL (ref 0.44–1.00)
GFR calc Af Amer: >60 mL/min (ref >60)
GFR calc non Af Amer: >60 mL/min (ref >60)
Glucose, Bld: 101 mg/dL — ABNORMAL HIGH (ref 70–99)
Potassium: 4 mmol/L (ref 3.5–5.1)
Sodium: 137 mmol/L (ref 135–145)
Total Bilirubin: 0.7 mg/dL (ref 0.3–1.2)
Total Protein: 7.1 g/dL (ref 6.5–8.1)

## 2018-01-31 LAB — LIPASE, BLOOD: Lipase: 26 U/L (ref 11–51)

## 2018-01-31 MED ORDER — PROMETHAZINE HCL 25 MG PO TABS: 25.0000 mg | ORAL_TABLET | Freq: Four times a day (QID) | ORAL | 0 refills | Status: DC | PRN

## 2018-01-31 MED ORDER — OXYCODONE-ACETAMINOPHEN 5-325 MG PO TABS
2.0000 | ORAL_TABLET | Freq: Once | ORAL | Status: AC
  Administered 2018-01-31: 2 via ORAL
  Filled 2018-01-31: qty 2

## 2018-01-31 MED ORDER — METOCLOPRAMIDE HCL 5 MG/ML IJ SOLN
10.0000 mg | Freq: Once | INTRAMUSCULAR | Status: AC
  Administered 2018-01-31: 10 mg via INTRAVENOUS
  Filled 2018-01-31: qty 2

## 2018-01-31 MED ORDER — LACTATED RINGERS IV BOLUS
1000.0000 mL | Freq: Once | INTRAVENOUS | Status: AC
  Administered 2018-01-31: 1000 mL via INTRAVENOUS

## 2018-01-31 MED ORDER — FENTANYL CITRATE (PF) 100 MCG/2ML IJ SOLN
50.0000 ug | Freq: Once | INTRAMUSCULAR | Status: AC
  Administered 2018-01-31: 50 ug via INTRAVENOUS
  Filled 2018-01-31: qty 2

## 2018-01-31 MED ORDER — IOPAMIDOL (ISOVUE-300) INJECTION 61%
100.0000 mL | Freq: Once | INTRAVENOUS | Status: AC | PRN
  Administered 2018-01-31: 100 mL via INTRAVENOUS

## 2018-01-31 NOTE — ED Provider Notes (Signed)
[facility] EMERGENCY DEPARTMENT Provider Note   CSN: [id number] Arrival date & time: 01/31/18  0501     History   Chief Complaint Chief Complaint  Patient presents with  . Abdominal Pain    HPI Kendra Ho is a 37 y.o. female.   Abdominal Pain   This is a new problem. The current episode started 1 to 2 hours ago. The problem occurs constantly. The problem has not changed since onset.The pain is associated with a previous surgery. The pain is located in the epigastric region. The pain is moderate. Associated symptoms include nausea and vomiting. Pertinent negatives include anorexia, fever, melena and frequency.    Past Medical History:  Diagnosis Date  . Anxiety   . Panic attack     Patient Active Problem List   Diagnosis Date Noted  . Calculus of gallbladder without cholecystitis without obstruction   . Hepatitis C antibody test positive   . Acute cholecystitis   . Anxiety 01/14/2018  . Cholecystitis 01/14/2018    Past Surgical History:  Procedure Laterality Date  . CHOLECYSTECTOMY N/A 01/25/2018   Procedure: LAPAROSCOPIC CHOLECYSTECTOMY;  Surgeon: Franky Macho, MD;  Location: [facility];  Service: General;  Laterality: N/A;  . LIVER BIOPSY N/A 01/25/2018   Procedure: LIVER BIOPSY;  Surgeon: Franky Macho, MD;  Location: [facility];  Service: General;  Laterality: N/A;     OB History    Gravida  3   Para  3   Term  3   Preterm      AB      Living        SAB      TAB      Ectopic      Multiple      Live Births               Home Medications    Prior to Admission medications   Medication Sig Start Date End Date Taking? Authorizing Provider  busPIRone (BUSPAR) 10 MG tablet Take 10 mg by mouth 2 (two) times daily. 12/30/17   [provider]  fluticasone (FLONASE) 50 MCG/ACT nasal spray Place 2 sprays into both nostrils daily as needed for allergies.     [provider]  ondansetron (ZOFRAN) 4 MG tablet Take 4 mg by  mouth every 6 (six) hours as needed for nausea or vomiting.    [provider]  oxyCODONE-acetaminophen (PERCOCET) 7.5-325 MG tablet Take 1 tablet by mouth every 6 (six) hours as needed for severe pain. 01/25/18 01/25/19  Franky Macho, MD  promethazine (PHENERGAN) 25 MG tablet Take 1 tablet (25 mg total) by mouth every 6 (six) hours as needed for nausea or vomiting. 01/31/18   Trendon Zaring, Barbara Cower, MD  QUEtiapine (SEROQUEL) 50 MG tablet Take 1 tablet by mouth at bedtime. 10/30/17   [provider]    Family History Family History  Problem Relation Age of Onset  . Obesity Mother     Social History Social History   Tobacco Use  . Smoking status: Current Every Day Smoker    Packs/day: 1.00    Types: Cigarettes  . Smokeless tobacco: Never Used  Substance Use Topics  . Alcohol use: Yes    Comment: 12 pack 1 week ago  . Drug use: No     Allergies   Risperdal [risperidone] and Bee venom   Review of Systems Review of Systems  Constitutional: Negative for fever.  Gastrointestinal: Positive for abdominal pain, nausea and vomiting. Negative for anorexia  and melena.  Genitourinary: Negative for frequency.  All other systems reviewed and are negative.    Physical Exam Updated Vital Signs BP (!) 110/57   Pulse 84   Temp 97.9 F (36.6 C) (Oral)   Resp (!) 24   Ht 5' (1.524 m)   Wt 83 kg   LMP 01/11/2018   SpO2 100%   BMI 35.74 kg/m   Physical Exam Vitals signs and nursing note reviewed.  Constitutional:      Appearance: She is well-developed.  HENT:     Head: Normocephalic and atraumatic.     Nose: Nose normal.     Mouth/Throat:     Mouth: Mucous membranes are moist.  Eyes:     Pupils: Pupils are equal, round, and reactive to light.  Neck:     Musculoskeletal: Normal range of motion.  Cardiovascular:     Rate and Rhythm: Normal rate and regular rhythm.  Pulmonary:     Effort: Pulmonary effort is normal. No respiratory distress.     Breath sounds: No  stridor.  Abdominal:     General: There is no distension.     Tenderness: There is abdominal tenderness in the epigastric area.  Musculoskeletal: Normal range of motion.        General: No swelling or tenderness.  Skin:    General: Skin is warm and dry.  Neurological:     General: No focal deficit present.     Mental Status: She is alert.      ED Treatments / Results  Labs (all labs ordered are listed, but only abnormal results are displayed) Labs Reviewed  COMPREHENSIVE METABOLIC PANEL - Abnormal; Notable for the following components:      Result Value   CO2 20 (*)    Glucose, Bld 101 (*)    AST 48 (*)    ALT 57 (*)    All other components within normal limits  URINALYSIS, ROUTINE W REFLEX MICROSCOPIC - Abnormal; Notable for the following components:   Specific Gravity, Urine >1.046 (*)    All other components within normal limits  CBC WITH DIFFERENTIAL/PLATELET  LIPASE, BLOOD    EKG None  Radiology Ct Abdomen Pelvis W Contrast  Result Date: 01/31/2018 CLINICAL DATA:  Cholecystectomy January 25, 2018. The patient awoke with severe right upper quadrant pain which is nearly resolved. EXAM: CT ABDOMEN AND PELVIS WITH CONTRAST TECHNIQUE: Multidetector CT imaging of the abdomen and pelvis was performed using the standard protocol following bolus administration of intravenous contrast. CONTRAST:  ISOVUE-300 IOPAMIDOL (ISOVUE-300) INJECTION 61% COMPARISON:  January 14, 2018 FINDINGS: Lower chest: Mild dependent atelectasis. Tiny bilateral pleural effusions. Lower chest otherwise normal. Hepatobiliary: The patient is status post cholecystectomy. There is a small amount of fluid in the cholecystectomy bed surrounding the cholecystectomy clips. The fluid is confined to the bed with no perihepatic or pericolic gutter fluid identified. No fluid in the pelvis. There is a rounded focus of air within the anterior aspect of the fluid as seen on series 2, image 27. The portal vein is  patent. No hepatic masses are noted. The common bile duct is normal in caliber given recent cholecystectomy. Pancreas: Unremarkable. No pancreatic ductal dilatation or surrounding inflammatory changes. Spleen: Normal in size without focal abnormality. Adrenals/Urinary Tract: 2 small stones are seen in the lower pole of the right kidney with another 2 small stones in the lower pole left kidney. No hydronephrosis or perinephric stranding. No ureterectasis or ureteral stones. The bladder is unremarkable. Stomach/Bowel:  The stomach and small bowel are normal. The colon is unremarkable. The appendix is normal. Vascular/Lymphatic: No significant vascular findings are present. No enlarged abdominal or pelvic lymph nodes. Reproductive: Uterus is normal. There is a dominant follicle in the right ovary measuring 3.1 cm. Uterus and left ovary are unremarkable. Other: No abdominal wall hernia or abnormality. No abdominopelvic ascites. Musculoskeletal: No acute or significant osseous findings. IMPRESSION: 1. There is a small fluid collection in the gallbladder fossa measuring 3.8 by 2.1 by 2.3 cm. There is a focus of air within the collection. The fluid collection and air could simply be postoperative as the surgery was only 6 days ago. There is no enhancing rim to definitely suggest an abscess. The amount of fluid is less than would typically be seen with a biliary leak, 6 days after surgery. Recommend clinical correlation and follow-up as clinically warranted. 2. Nonobstructive stones in the kidneys. 3. Dominant 3.1 cm follicle in the right ovary. 4. Tiny pleural effusions. Electronically Signed   By: Gerome Samavid  Williams III M.D   On: 01/31/2018 07:30    Procedures Procedures (including critical care time)  Medications Ordered in ED Medications  oxyCODONE-acetaminophen (PERCOCET/ROXICET) 5-325 MG per tablet 2 tablet (has no administration in time range)  lactated ringers bolus 1,000 mL (1,000 mLs Intravenous New Bag/Given  01/31/18 0547)  metoCLOPramide (REGLAN) injection 10 mg (10 mg Intravenous Given 01/31/18 0547)  fentaNYL (SUBLIMAZE) injection 50 mcg (50 mcg Intravenous Given 01/31/18 0547)  iopamidol (ISOVUE-300) 61 % injection 100 mL (100 mLs Intravenous Contrast Given 01/31/18 0646)     Initial Impression / Assessment and Plan / ED Course  I have reviewed the triage vital signs and the nursing notes.  Pertinent labs & imaging results that were available during my care of the patient were reviewed by me and considered in my medical decision making (see chart for details).   She came in with an acute episode of epigastric pain that woke her up from sleep.  It was associated with nonbloody nonbilious vomiting.  On examination she had mild epigastric tenderness but no rebound, guarding or other evidence of peritonitis.  Secondary to the amount of pain CT scan was done which showed a fluid collection where her gallbladder was.  Her pain was controlled and nausea was controlled with 1 round of IV medications in the emergency room.  Her abdomen remained nonsurgical.  The fluid collection I think is more consistent with normal postop fluid however, I cannot definitively say it was not an abscess or bile leak (unlikely with acute nature of it and how quickly it resolved) so will have her follow up with her surgeon. Will return here if return of severe pain or vomiting.    Final Clinical Impressions(s) / ED Diagnoses   Final diagnoses:  Abdominal pain, unspecified abdominal location    ED Discharge Orders         Ordered    promethazine (PHENERGAN) 25 MG tablet  Every 6 hours PRN,   Status:  Discontinued     01/31/18 0742    promethazine (PHENERGAN) 25 MG tablet  Every 6 hours PRN     01/31/18 0742           Falan Hensler, Barbara CowerJason, MD 01/31/18 [phone]

## 2018-01-31 NOTE — ED Notes (Signed)
Pt states she is starting to have pain again. Explained to pt that the po percocet she just received may take 45 minutes to take effect.  Pt ask if edp wrote her a prescription for pain medication.  States she has a few pain pills left written by Dr Lovell SheehanJenkins.  Informed pt to call Dr. Lovell SheehanJenkins in the morning for pain medication refill.

## 2018-01-31 NOTE — ED Triage Notes (Signed)
Pt c/o abd pain with n/v that started during the night, had gallbladder surgery on 01/25/2018.

## 2018-02-01 ENCOUNTER — Telehealth: Payer: Self-pay | Admitting: Emergency Medicine

## 2018-02-01 NOTE — Telephone Encounter (Signed)
Patient called stated she is hurting. Went to ER and they did a CT stated everything looked good and to follow up with doctor jenkins, notified her that doctor jenkins is in surgery today and to keep her appointment for tomorrow since she is still hurting

## 2018-02-02 ENCOUNTER — Encounter: Payer: Self-pay | Admitting: General Surgery

## 2018-02-02 ENCOUNTER — Ambulatory Visit (INDEPENDENT_AMBULATORY_CARE_PROVIDER_SITE_OTHER): Payer: Self-pay | Admitting: General Surgery

## 2018-02-02 VITALS — BP 127/78 | HR 95 | Temp 96.2°F | Resp 18 | Wt 181.4 lb

## 2018-02-02 DIAGNOSIS — Z09 Encounter for follow-up examination after completed treatment for conditions other than malignant neoplasm: Secondary | ICD-10-CM

## 2018-02-02 MED ORDER — OXYCODONE-ACETAMINOPHEN 7.5-325 MG PO TABS: 1.0000 | ORAL_TABLET | Freq: Four times a day (QID) | ORAL | 0 refills | Status: AC | PRN

## 2018-02-02 NOTE — Progress Notes (Signed)
Subjective:     Kendra Ho  Patient is here for follow-up after laparoscopic cholecystectomy, liver biopsy.  She did have an episode of sharp pain which was evaluated by the emergency room.  No specific complications were found.  She has been feeling fine since.  She only has mild incisional pain with certain movement.  No fever, chills, jaundice have been noted. Objective:    BP 127/78 (BP Location: Left Arm, Patient Position: Sitting, Cuff Size: Normal)   Pulse 95   Temp (!) 96.2 F (35.7 C) (Temporal)   Resp 18   Wt 181 lb 6.4 oz (82.3 kg)   LMP 01/11/2018   BMI 35.43 kg/m   General:  alert, cooperative and no distress  Abdomen soft, incisions healing well.  Staples removed, Steri-Strips applied. Final pathology discussed with the patient, consistent with diagnosis.     Assessment:    Doing well postoperatively.    Plan:   I did reorder her Percocet.  I told her this would be her final pain prescription.  She will be following up with a gastroenterologist next month concerning her hepatitis.  She may return to work without restrictions on 02/11/2018.  Follow-up here as needed.

## 2018-02-04 ENCOUNTER — Encounter: Payer: Self-pay | Admitting: Emergency Medicine

## 2018-02-08 ENCOUNTER — Telehealth: Payer: Self-pay | Admitting: Emergency Medicine

## 2018-02-08 NOTE — Telephone Encounter (Signed)
Patient called and stated she needed a work not stated she was seen here in the office on 12/5 and 12/17 and that she may return to work on 12/26, printed letter in epic for patient to pick up on 12/24

## 2018-03-03 ENCOUNTER — Emergency Department (HOSPITAL_COMMUNITY)
Admission: EM | Admit: 2018-03-03 | Discharge: 2018-03-03 | Disposition: A | Discharge status: Home / Self Care | Payer: Self-pay | Attending: Emergency Medicine | Admitting: Emergency Medicine

## 2018-03-03 ENCOUNTER — Encounter (HOSPITAL_COMMUNITY): Payer: Self-pay | Admitting: Emergency Medicine

## 2018-03-03 ENCOUNTER — Other Ambulatory Visit: Payer: Self-pay

## 2018-03-03 DIAGNOSIS — M25572 Pain in left ankle and joints of left foot: Secondary | ICD-10-CM | POA: Insufficient documentation

## 2018-03-03 DIAGNOSIS — M25571 Pain in right ankle and joints of right foot: Secondary | ICD-10-CM | POA: Insufficient documentation

## 2018-03-03 MED ORDER — ACETAMINOPHEN 500 MG PO TABS
1000.0000 mg | ORAL_TABLET | Freq: Once | ORAL | Status: AC
  Administered 2018-03-03: 1000 mg via ORAL
  Filled 2018-03-03: qty 2

## 2018-03-03 MED ORDER — IBUPROFEN 800 MG PO TABS
800.0000 mg | ORAL_TABLET | Freq: Once | ORAL | Status: AC
  Administered 2018-03-03: 800 mg via ORAL
  Filled 2018-03-03: qty 1

## 2018-03-03 NOTE — ED Notes (Signed)
Pt ambulates to restroom with steady and even gait.

## 2018-03-03 NOTE — ED Provider Notes (Signed)
[facility] EMERGENCY DEPARTMENT Provider Note   CSN: [id number] Arrival date & time: 03/03/18  1151     History   Chief Complaint Chief Complaint  Patient presents with  . Leg Swelling    HPI Kendra Ho is a 38 y.o. female.  Patient is a 38 year old female who presents to the emergency department with a complaint of bilateral ankle pain.  The patient states she has been noticing bilateral ankle edema and pain recently.  She is just started a new job  The history is provided by the patient.    Past Medical History:  Diagnosis Date  . Anxiety   . Panic attack     Patient Active Problem List   Diagnosis Date Noted  . Calculus of gallbladder without cholecystitis without obstruction   . Hepatitis C antibody test positive   . Acute cholecystitis   . Anxiety 01/14/2018  . Cholecystitis 01/14/2018    Past Surgical History:  Procedure Laterality Date  . CHOLECYSTECTOMY N/A 01/25/2018   Procedure: LAPAROSCOPIC CHOLECYSTECTOMY;  Surgeon: Franky Macho, MD;  Location: [facility];  Service: General;  Laterality: N/A;  . LIVER BIOPSY N/A 01/25/2018   Procedure: LIVER BIOPSY;  Surgeon: Franky Macho, MD;  Location: [facility];  Service: General;  Laterality: N/A;     OB History    Gravida  3   Para  3   Term  3   Preterm      AB      Living        SAB      TAB      Ectopic      Multiple      Live Births               Home Medications    Prior to Admission medications   Medication Sig Start Date End Date Taking? Authorizing Provider  busPIRone (BUSPAR) 10 MG tablet Take 10 mg by mouth 2 (two) times daily. 12/30/17   [provider]  fluticasone (FLONASE) 50 MCG/ACT nasal spray Place 2 sprays into both nostrils daily as needed for allergies.     [provider]  ondansetron (ZOFRAN) 4 MG tablet Take 4 mg by mouth every 6 (six) hours as needed for nausea or vomiting.    [provider]  oxyCODONE-acetaminophen (PERCOCET)  7.5-325 MG tablet Take 1 tablet by mouth every 6 (six) hours as needed for severe pain. 02/02/18 02/02/19  Franky Macho, MD  promethazine (PHENERGAN) 25 MG tablet Take 1 tablet (25 mg total) by mouth every 6 (six) hours as needed for nausea or vomiting. 01/31/18   Mesner, Barbara Cower, MD  QUEtiapine (SEROQUEL) 50 MG tablet Take 1 tablet by mouth at bedtime. 10/30/17   [provider]    Family History Family History  Problem Relation Age of Onset  . Obesity Mother     Social History Social History   Tobacco Use  . Smoking status: Current Every Day Smoker    Packs/day: 1.00    Types: Cigarettes  . Smokeless tobacco: Never Used  Substance Use Topics  . Alcohol use: Not Currently    Comment: 12 pack 1 week ago  . Drug use: No     Allergies   Risperdal [risperidone] and Bee venom   Review of Systems Review of Systems  Constitutional: Negative for activity change.       All ROS Neg except as noted in HPI  HENT: Negative for nosebleeds.   Eyes: Negative for photophobia  and discharge.  Respiratory: Negative for cough, shortness of breath and wheezing.   Cardiovascular: Negative for chest pain and palpitations.  Gastrointestinal: Negative for abdominal pain and blood in stool.  Genitourinary: Negative for dysuria, frequency and hematuria.  Musculoskeletal: Negative for arthralgias, back pain and neck pain.       Foot and ankle pain.  Skin: Negative.   Neurological: Negative for dizziness, seizures and speech difficulty.  Psychiatric/Behavioral: Negative for confusion and hallucinations.     Physical Exam Updated Vital Signs BP 118/64 (BP Location: Right Arm)   Pulse 78   Temp 98.1 F (36.7 C) (Oral)   Resp 18   Ht 5' (1.524 m)   Wt 83.5 kg   SpO2 98%   BMI 35.94 kg/m   Physical Exam Vitals signs and nursing note reviewed.  Constitutional:      Appearance: She is well-developed. She is not toxic-appearing.  HENT:     Head: Normocephalic.     Right Ear:  Tympanic membrane and external ear normal.     Left Ear: Tympanic membrane and external ear normal.  Eyes:     General: Lids are normal.     Pupils: Pupils are equal, round, and reactive to light.  Neck:     Musculoskeletal: Normal range of motion and neck supple.     Vascular: No carotid bruit.  Cardiovascular:     Rate and Rhythm: Normal rate and regular rhythm.     Pulses: Normal pulses.     Heart sounds: Normal heart sounds.  Pulmonary:     Effort: No respiratory distress.     Breath sounds: Normal breath sounds.  Abdominal:     General: Bowel sounds are normal.     Palpations: Abdomen is soft.     Tenderness: There is no abdominal tenderness. There is no guarding.  Musculoskeletal: Normal range of motion.  Lymphadenopathy:     Head:     Right side of head: No submandibular adenopathy.     Left side of head: No submandibular adenopathy.     Cervical: No cervical adenopathy.  Skin:    General: Skin is warm and dry.  Neurological:     Mental Status: She is alert and oriented to person, place, and time.     Cranial Nerves: No cranial nerve deficit.     Sensory: No sensory deficit.  Psychiatric:        Speech: Speech normal.      ED Treatments / Results  Labs (all labs ordered are listed, but only abnormal results are displayed) Labs Reviewed - No data to display  EKG None  Radiology No results found.  Procedures Procedures (including critical care time)  Medications Ordered in ED Medications - No data to display   Initial Impression / Assessment and Plan / ED Course  I have reviewed the triage vital signs and the nursing notes.  Pertinent labs & imaging results that were available during my care of the patient were reviewed by me and considered in my medical decision making (see chart for details).       Final Clinical Impressions(s) / ED Diagnoses MDM  Vital signs are within normal limits.  There are no neurovascular deficits noted on the  examination.  The examination favors overuse leading to an inflammatory attack.  I have asked the patient to use Tylenol and ibuprofen with each meal and at bedtime.  I have also asked her to elevate her legs after work.  Patient is referred to  [facility] for podiatry evaluation and management.  Patient is provided with bilateral ankle stirrup splints.   Final diagnoses:  Acute bilateral ankle pain    ED Discharge Orders    None       Ivery Quale, PA-C 03/03/18 1424    Pricilla Loveless, MD 03/03/18 1441

## 2018-03-03 NOTE — ED Notes (Signed)
ED Provider at bedside. 

## 2018-03-03 NOTE — ED Notes (Signed)
Pt requesting work note

## 2018-03-03 NOTE — Discharge Instructions (Addendum)
Your vital signs within normal limits.  There are no neurologic or vascular deficits appreciated on your examination at this time.  There is no evidence for infection.  Your pain and swelling is most likely related to overuse, and repetitive use.  Please make sure you use shoes with good support.  You may want to go by the Dr. Margart Sickles kiosks at [facility] to see if the inserts there may be of help to you.  Please use the ankle stirrup splints provided for you here in the emergency department until you can try the inserts, or see the foot and ankle specialist Dr. Al Corpus.  Please use 600 mg of ibuprofen, and 1000 mg of Tylenol with breakfast, lunch, dinner, and at bedtime over the next 3 or 4 days, and then with each meal as needed for pain and soreness.  Please elevate your legs above your waist when sitting and above your heart when lying down after work.

## 2018-03-03 NOTE — ED Triage Notes (Addendum)
Bilateral lower ankle edema and foot pain,  recently started new job where she is on her feet for 8 hours.  Reports fever a few days ago but feels better now.

## 2018-06-04 ENCOUNTER — Emergency Department (HOSPITAL_COMMUNITY)
Admission: EM | Admit: 2018-06-04 | Discharge: 2018-06-04 | Disposition: A | Discharge status: Home / Self Care | Payer: Self-pay | Attending: Emergency Medicine | Admitting: Emergency Medicine

## 2018-06-04 ENCOUNTER — Other Ambulatory Visit: Payer: Self-pay

## 2018-06-04 ENCOUNTER — Encounter (HOSPITAL_COMMUNITY): Payer: Self-pay | Admitting: Emergency Medicine

## 2018-06-04 DIAGNOSIS — S39012A Strain of muscle, fascia and tendon of lower back, initial encounter: Secondary | ICD-10-CM | POA: Insufficient documentation

## 2018-06-04 DIAGNOSIS — Y998 Other external cause status: Secondary | ICD-10-CM | POA: Insufficient documentation

## 2018-06-04 DIAGNOSIS — Y9289 Other specified places as the place of occurrence of the external cause: Secondary | ICD-10-CM | POA: Insufficient documentation

## 2018-06-04 DIAGNOSIS — Y9389 Activity, other specified: Secondary | ICD-10-CM | POA: Insufficient documentation

## 2018-06-04 DIAGNOSIS — X500XXA Overexertion from strenuous movement or load, initial encounter: Secondary | ICD-10-CM | POA: Insufficient documentation

## 2018-06-04 MED ORDER — METHOCARBAMOL 500 MG PO TABS: 500.0000 mg | ORAL_TABLET | Freq: Three times a day (TID) | ORAL | 0 refills | Status: DC | PRN

## 2018-06-04 MED ORDER — PREDNISONE 20 MG PO TABS: 40.0000 mg | ORAL_TABLET | Freq: Every day | ORAL | 0 refills | Status: DC

## 2018-06-04 NOTE — ED Triage Notes (Signed)
Patient complaining of lower back pain x 3 days after cleaning her house. Denies dysuria.

## 2018-06-04 NOTE — ED Provider Notes (Signed)
[facility] EMERGENCY DEPARTMENT Provider Note   CSN: [id number] Arrival date & time: 06/04/18  9381    History   Chief Complaint Chief Complaint  Patient presents with  . Back Pain    HPI Kendra Ho is a 38 y.o. female.     HPI Patient presents with low back pain.  Worse with movement.  Began after cleaning her house and moving furniture around.  States it is dull.  No fevers or chills.  No numbness weakness.  No dysuria.  No loss of bladder or bowel control.  Has not really have much of a back pain history.  States she thinks it is sprained her back.  She is wondering if there is a brace or something else to help.  Denies possibly pregnancy.  Pain is dull.  States the pain will sometimes get more severe and feels as if something catches. Past Medical History:  Diagnosis Date  . Anxiety   . Panic attack     Patient Active Problem List   Diagnosis Date Noted  . Calculus of gallbladder without cholecystitis without obstruction   . Hepatitis C antibody test positive   . Acute cholecystitis   . Anxiety 01/14/2018  . Cholecystitis 01/14/2018    Past Surgical History:  Procedure Laterality Date  . CHOLECYSTECTOMY N/A 01/25/2018   Procedure: LAPAROSCOPIC CHOLECYSTECTOMY;  Surgeon: Franky Macho, MD;  Location: [facility];  Service: General;  Laterality: N/A;  . LIVER BIOPSY N/A 01/25/2018   Procedure: LIVER BIOPSY;  Surgeon: Franky Macho, MD;  Location: [facility];  Service: General;  Laterality: N/A;     OB History    Gravida  3   Para  3   Term  3   Preterm      AB      Living        SAB      TAB      Ectopic      Multiple      Live Births               Home Medications    Prior to Admission medications   Medication Sig Start Date End Date Taking? Authorizing Provider  busPIRone (BUSPAR) 10 MG tablet Take 10 mg by mouth 2 (two) times daily. 12/30/17   [provider]  fluticasone (FLONASE) 50 MCG/ACT nasal spray Place 2 sprays into  both nostrils daily as needed for allergies.     [provider]  methocarbamol (ROBAXIN) 500 MG tablet Take 1 tablet (500 mg total) by mouth every 8 (eight) hours as needed for muscle spasms. 06/04/18   Benjiman Core, MD  ondansetron (ZOFRAN) 4 MG tablet Take 4 mg by mouth every 6 (six) hours as needed for nausea or vomiting.    [provider]  oxyCODONE-acetaminophen (PERCOCET) 7.5-325 MG tablet Take 1 tablet by mouth every 6 (six) hours as needed for severe pain. 02/02/18 02/02/19  Franky Macho, MD  predniSONE (DELTASONE) 20 MG tablet Take 2 tablets (40 mg total) by mouth daily. 06/04/18   Benjiman Core, MD  promethazine (PHENERGAN) 25 MG tablet Take 1 tablet (25 mg total) by mouth every 6 (six) hours as needed for nausea or vomiting. 01/31/18   Mesner, Barbara Cower, MD  QUEtiapine (SEROQUEL) 50 MG tablet Take 1 tablet by mouth at bedtime. 10/30/17   [provider]    Family History Family History  Problem Relation Age of Onset  . Obesity Mother     Social History Social  History   Tobacco Use  . Smoking status: Current Every Day Smoker    Packs/day: 1.00    Types: Cigarettes  . Smokeless tobacco: Never Used  Substance Use Topics  . Alcohol use: Not Currently    Comment: 12 pack 1 week ago  . Drug use: No     Allergies   Risperdal [risperidone] and Bee venom   Review of Systems Review of Systems  Constitutional: Negative for appetite change.  Respiratory: Negative for shortness of breath.   Gastrointestinal: Negative for abdominal pain.  Genitourinary: Negative for flank pain.  Musculoskeletal: Positive for back pain.  Skin: Negative for rash.  Neurological: Negative for weakness.     Physical Exam Updated Vital Signs BP 113/60 (BP Location: Left Arm)   Pulse 86   Temp 98 F (36.7 C) (Oral)   Resp 18   Ht 5' (1.524 m)   Wt 83.9 kg   SpO2 96%   BMI 36.13 kg/m   Physical Exam Vitals signs and nursing note reviewed.  HENT:      Mouth/Throat:     Mouth: Mucous membranes are moist.  Cardiovascular:     Rate and Rhythm: Normal rate.  Pulmonary:     Breath sounds: No stridor. No wheezing or rhonchi.  Abdominal:     Tenderness: There is no abdominal tenderness.  Musculoskeletal:     Comments: Tenderness over lumbar spine, particularly paraspinal area.  No rash.  No deformity.  Sensation and strength intact in both bilateral lower extremities.  Perineal sensation grossly intact.  Skin:    General: Skin is warm.     Capillary Refill: Capillary refill takes less than 2 seconds.  Neurological:     General: No focal deficit present.     Mental Status: She is alert.      ED Treatments / Results  Labs (all labs ordered are listed, but only abnormal results are displayed) Labs Reviewed - No data to display  EKG None  Radiology No results found.  Procedures Procedures (including critical care time)  Medications Ordered in ED Medications - No data to display   Initial Impression / Assessment and Plan / ED Course  I have reviewed the triage vital signs and the nursing notes.  Pertinent labs & imaging results that were available during my care of the patient were reviewed by me and considered in my medical decision making (see chart for details).        Patient with back pain.  Likely musculoskeletal.  Will treat symptomatically.  Follow-up as an outpatient as needed.  Final Clinical Impressions(s) / ED Diagnoses   Final diagnoses:  Strain of lumbar region, initial encounter    ED Discharge Orders         Ordered    predniSONE (DELTASONE) 20 MG tablet  Daily     06/04/18 0826    methocarbamol (ROBAXIN) 500 MG tablet  Every 8 hours PRN     06/04/18 0826           Benjiman CorePickering, Delron Comer, MD 06/04/18 1054

## 2019-12-04 IMAGING — CT CT ABD-PELV W/ CM
2 of 3 series · 15 of 42 positions shown, 18 images · IV contrast (iopamidol)
Comparison: 10/02/2015 CT abdomen/pelvis.

CLINICAL DATA: Epigastric abdominal pain for 2 nights.
Leukocytosis. Fever. Normal lipase.

EXAM:
CT ABDOMEN AND PELVIS WITH CONTRAST
TECHNIQUE: Multidetector CT imaging of the abdomen and pelvis was performed
using the standard protocol following bolus administration of
intravenous contrast.
CONTRAST:  100mL 5C08QM-ETT IOPAMIDOL (5C08QM-ETT) INJECTION 61%

[Series 2: axial st · axial · 0.69mm/px · z∈[-400,+60]mm · 12 of 105 slices shown, 15 images]
[im 9/105  soft-tissue]
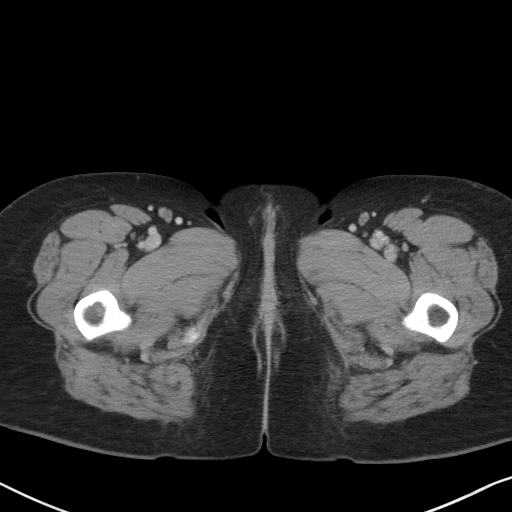
[im 9/105  bone]
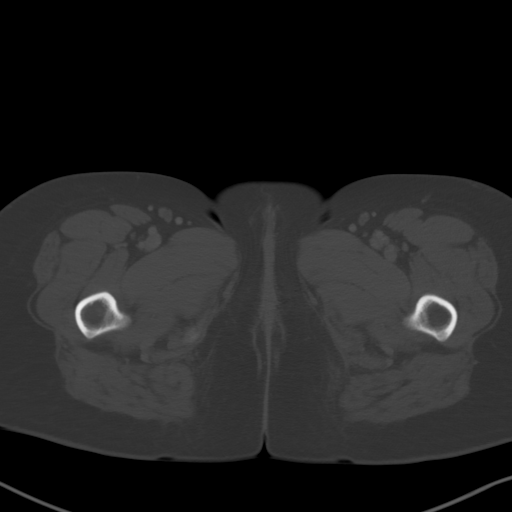
[im 21/105  soft-tissue]
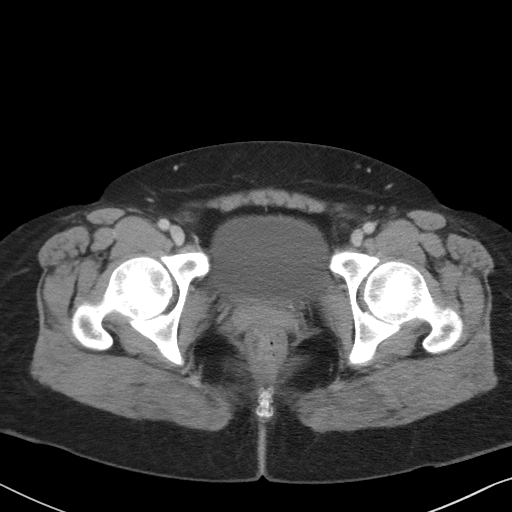
[im 29/105  soft-tissue]
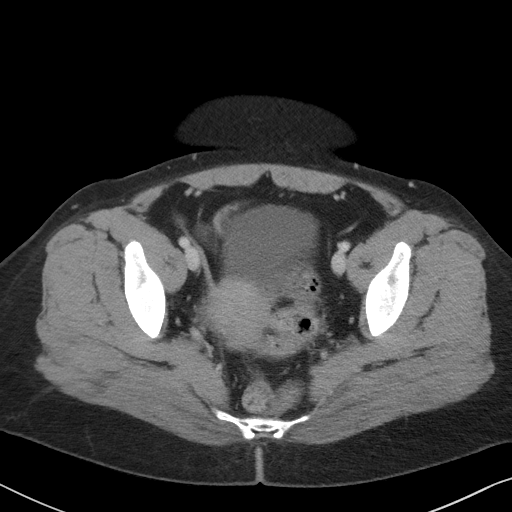
[im 41/105  soft-tissue]
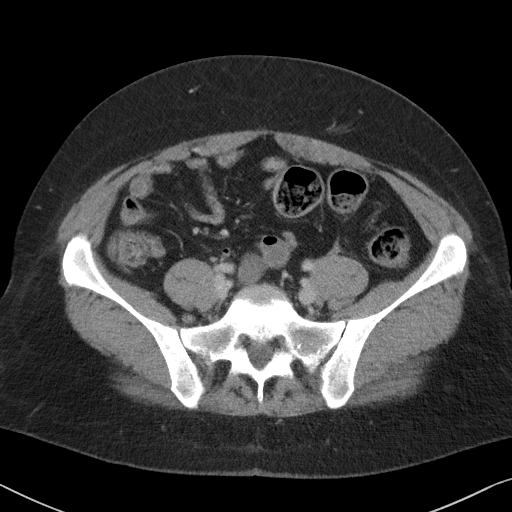
[im 53/105  soft-tissue]
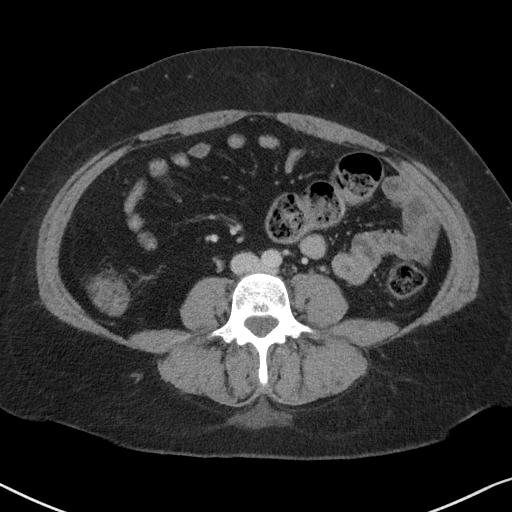
[im 65/105  soft-tissue]
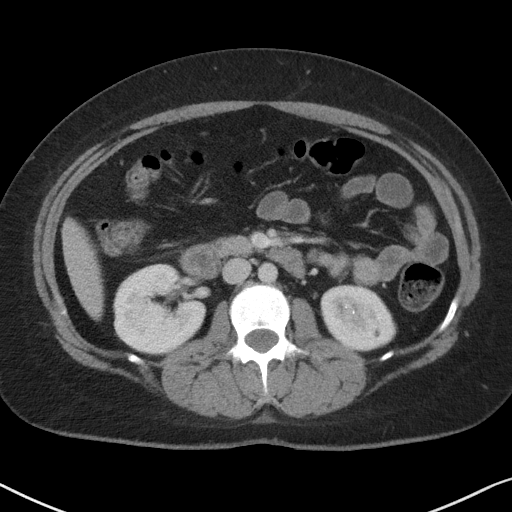
[im 77/105  soft-tissue]
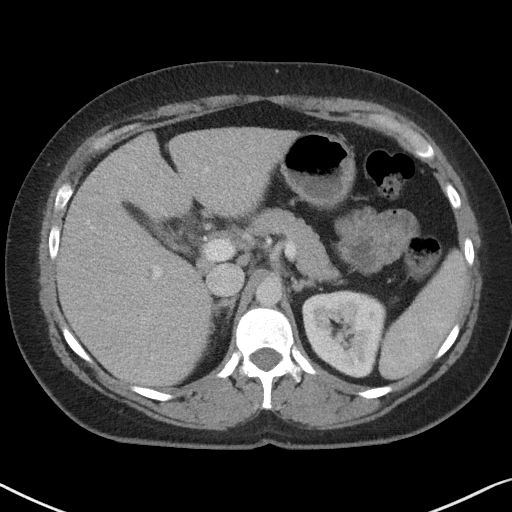
[im 85/105  soft-tissue]
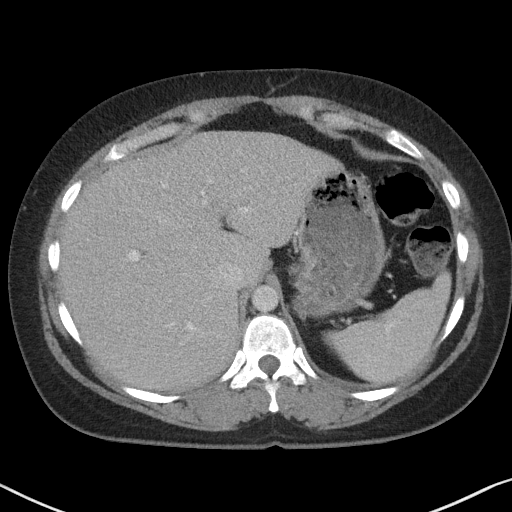
[im 89/105  lung]
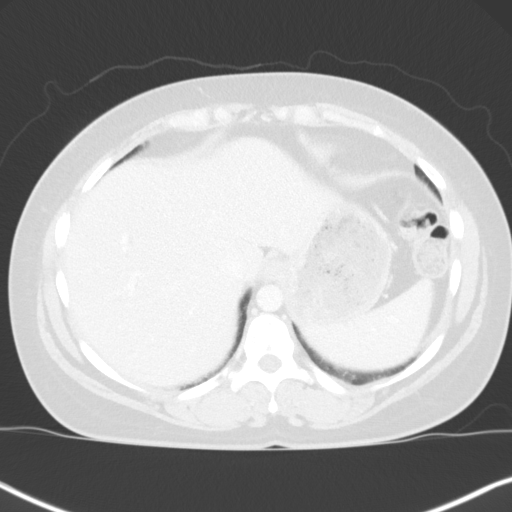
[im 93/105  lung]
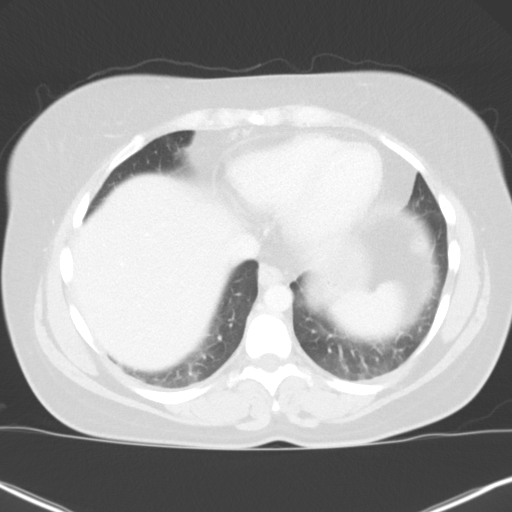
[im 97/105  soft-tissue]
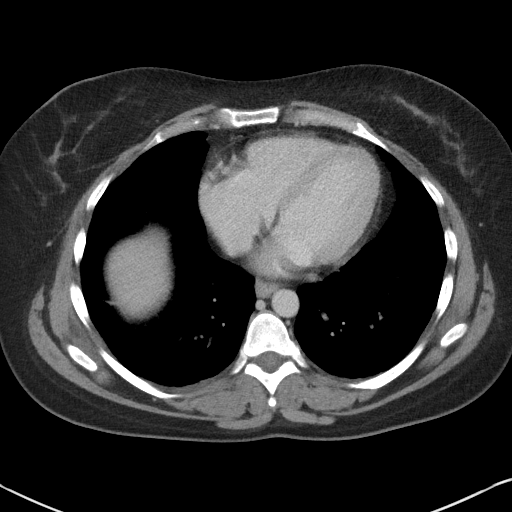
[im 97/105  lung]
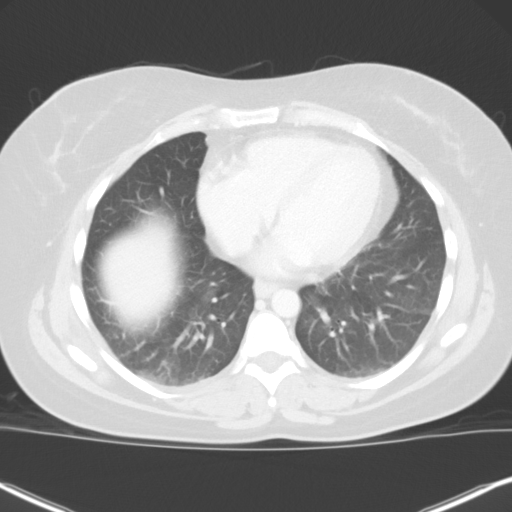
[im 97/105  bone]
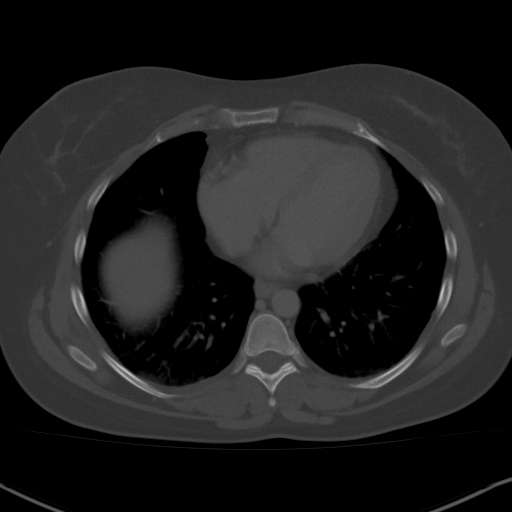
[im 101/105  lung]
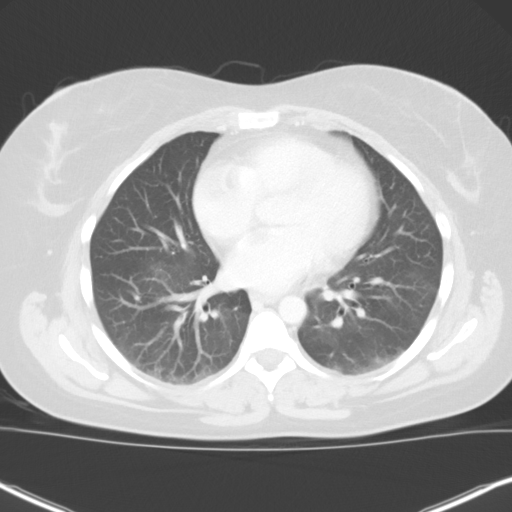

[Series 5: coronal st · coronal · 0.79mm/px · 3 of 117 slices shown]
[im 39/117  soft-tissue]
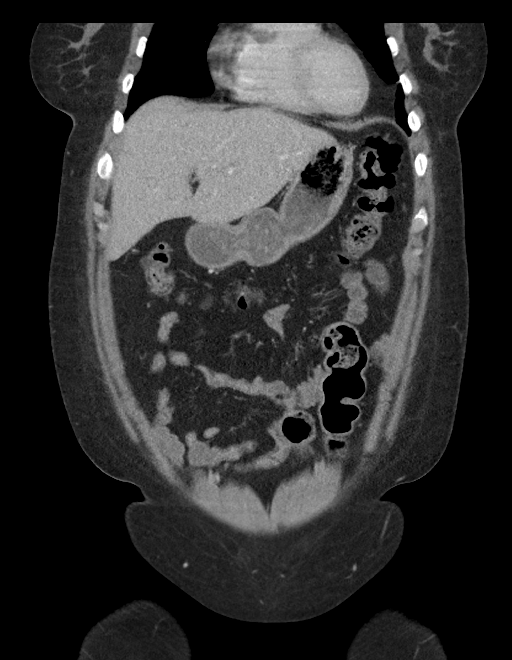
[im 52/117  soft-tissue]
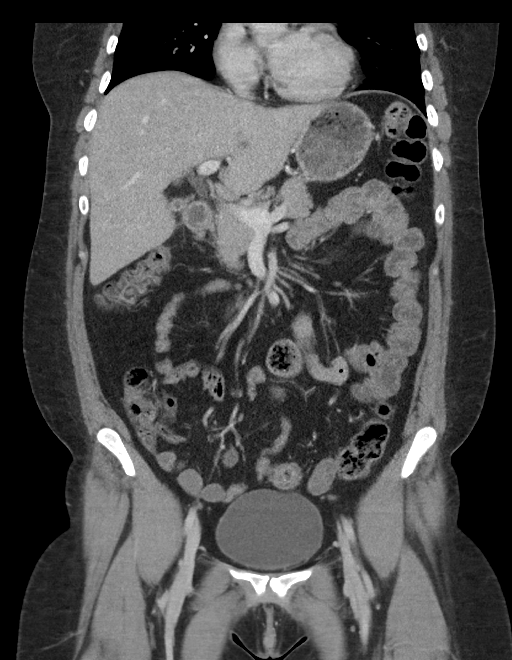
[im 65/117  soft-tissue]
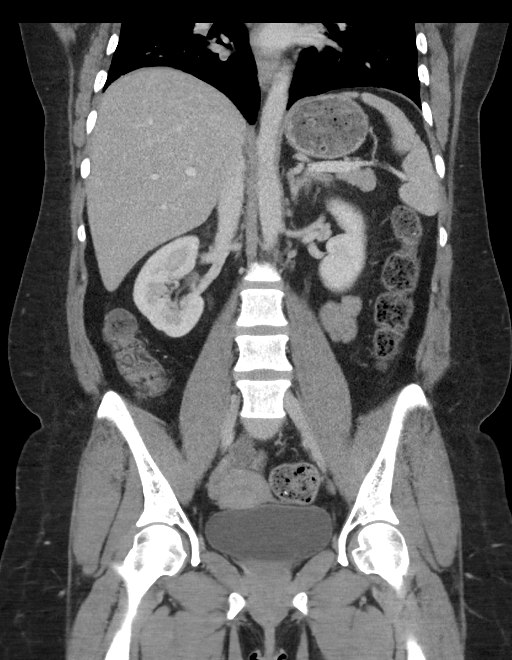

[15 of 42 positions shown; findings below may reference images not displayed]

FINDINGS: Lower chest: No significant pulmonary nodules or acute consolidative
airspace disease.

Hepatobiliary: Normal liver size. No liver mass. Mild diffuse
gallbladder wall thickening and gallbladder wall mucosal
hyperenhancement with pericholecystic fat stranding. No radiopaque
cholelithiasis. No biliary ductal dilatation. CBD diameter 5 mm.

Pancreas: Normal, with no mass or duct dilation.

Spleen: Normal size. No mass.

Adrenals/Urinary Tract: Normal adrenals. Nonobstructing 2 mm lower
right renal stone. Punctate nonobstructing 1 mm lower left renal
stones. No hydronephrosis. Subcentimeter hypodense renal cortical
lesions in the upper right and lower left kidney are too small to
characterize and require no follow-up. Normal bladder.

Stomach/Bowel: Normal non-distended stomach. Normal caliber small
bowel with no small bowel wall thickening. Normal appendix. Normal
large bowel with no diverticulosis, large bowel wall thickening or
pericolonic fat stranding.

Vascular/Lymphatic: Normal caliber abdominal aorta. Patent portal,
splenic, hepatic and renal veins. No pathologically enlarged lymph
nodes in the abdomen or pelvis.

Reproductive: Grossly normal uterus.  No adnexal mass.

Other: No pneumoperitoneum, ascites or focal fluid collection.

Musculoskeletal: No aggressive appearing focal osseous lesions.
IMPRESSION: 1. Nonspecific mild diffuse gallbladder wall thickening and mucosal
hyperenhancement with pericholecystic fat stranding. No radiopaque
cholelithiasis. If there is clinical concern for acute
cholecystitis, right upper quadrant abdominal ultrasound correlation
is suggested.
2. No biliary ductal dilatation.
3. No evidence of bowel obstruction or acute bowel inflammation.
Normal appendix.
4. Nonobstructing punctate bilateral nephrolithiasis.

## 2019-12-05 IMAGING — US US ABDOMEN LIMITED
1 series · 14 of 25 positions shown · non-contrast
Comparison: CT 01/14/2018

CLINICAL DATA: Cholecystitis

EXAM:
ULTRASOUND ABDOMEN LIMITED RIGHT UPPER QUADRANT

[Series 1: us abdomen limited · 14 of 66 slices shown]
[im 1/66]
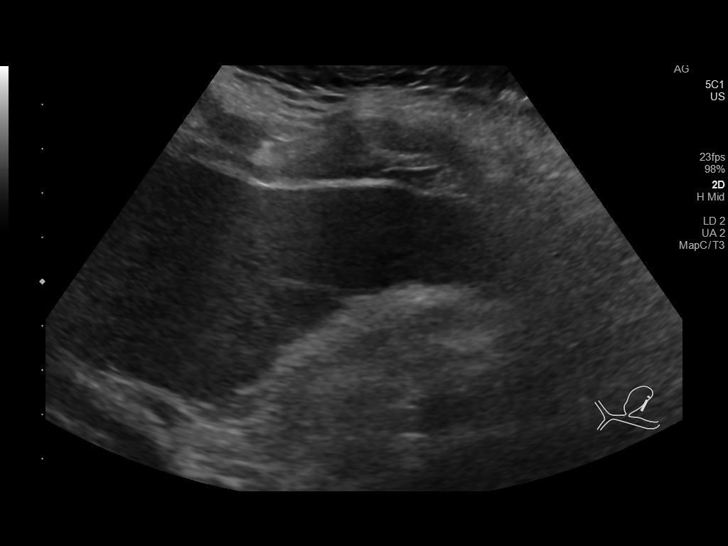
[im 6/66]
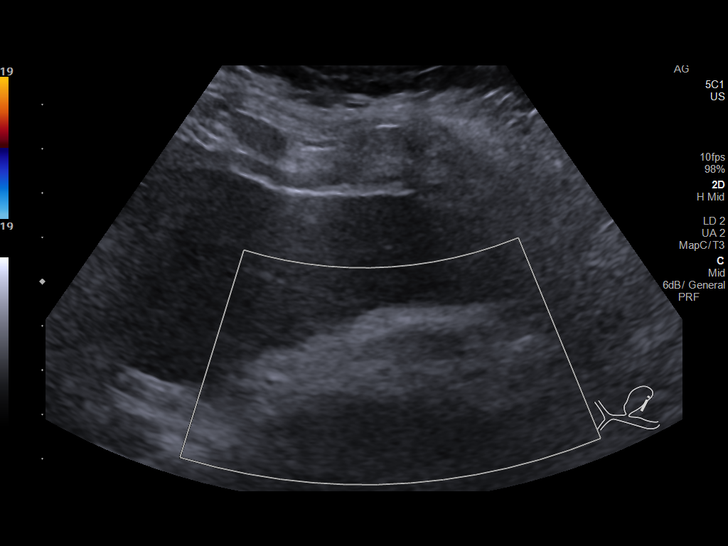
[im 11/66]
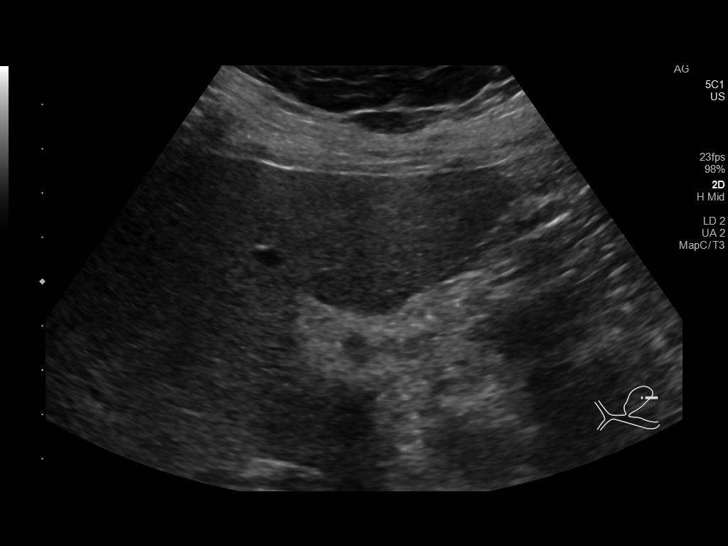
[im 17/66]
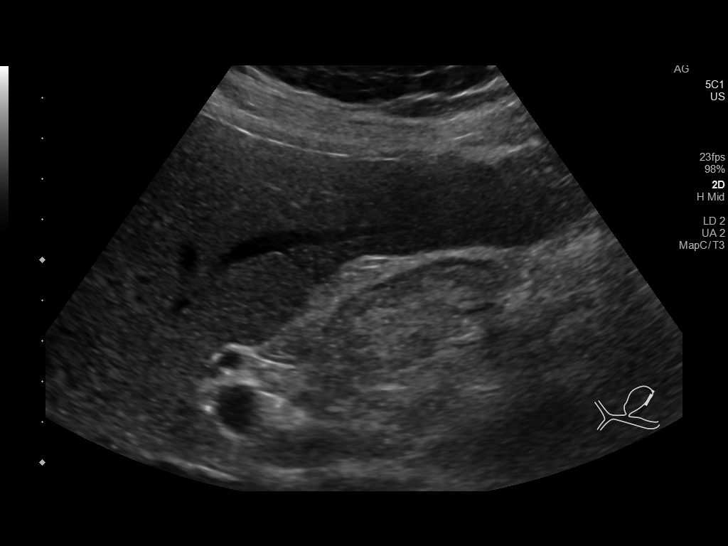
[im 22/66]
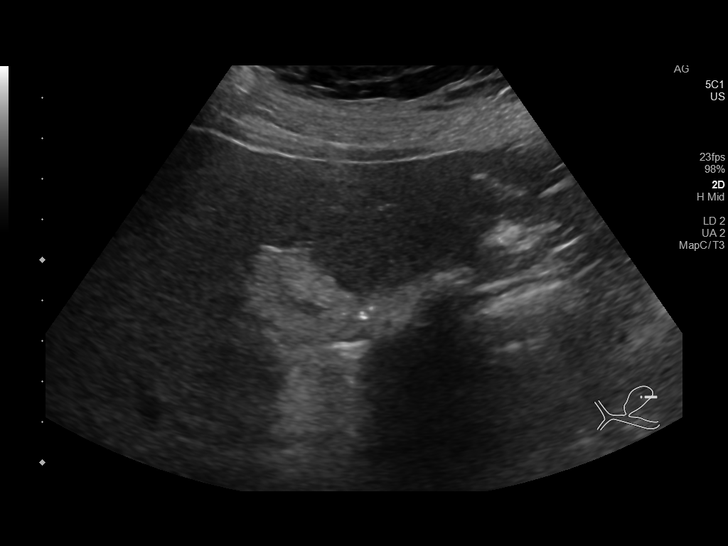
[im 25/66]
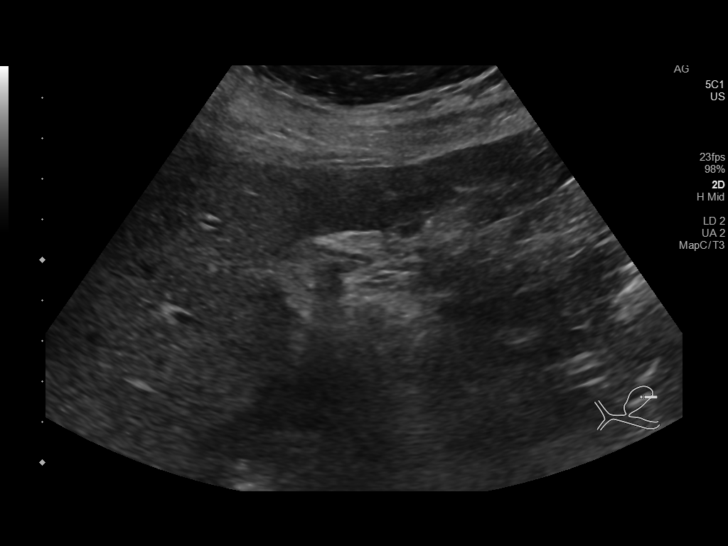
[im 30/66]
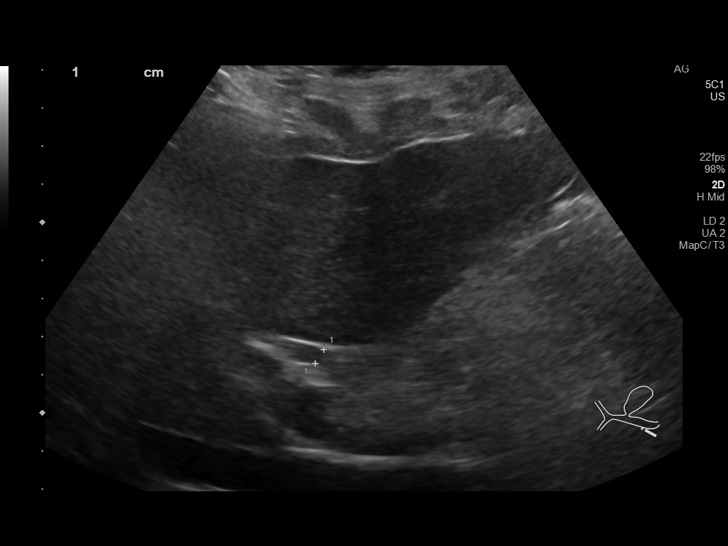
[im 36/66]
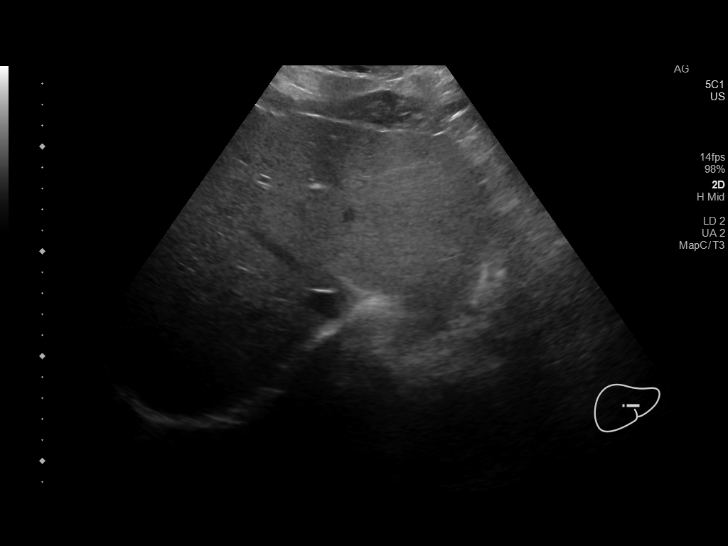
[im 41/66]
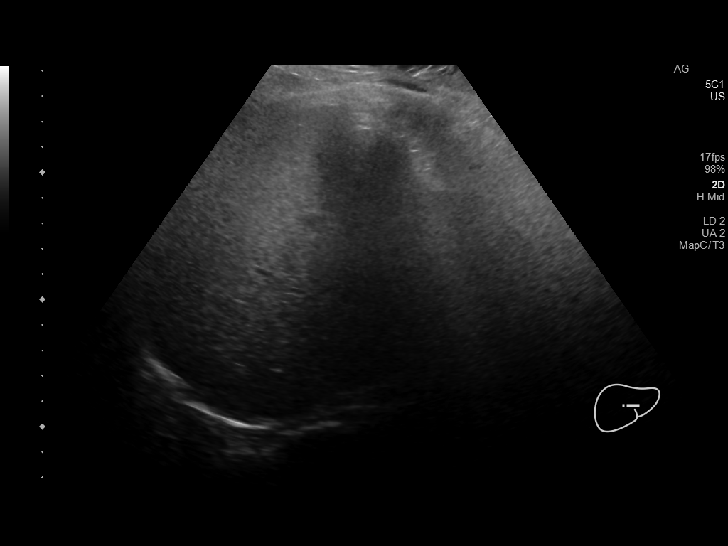
[im 44/66]
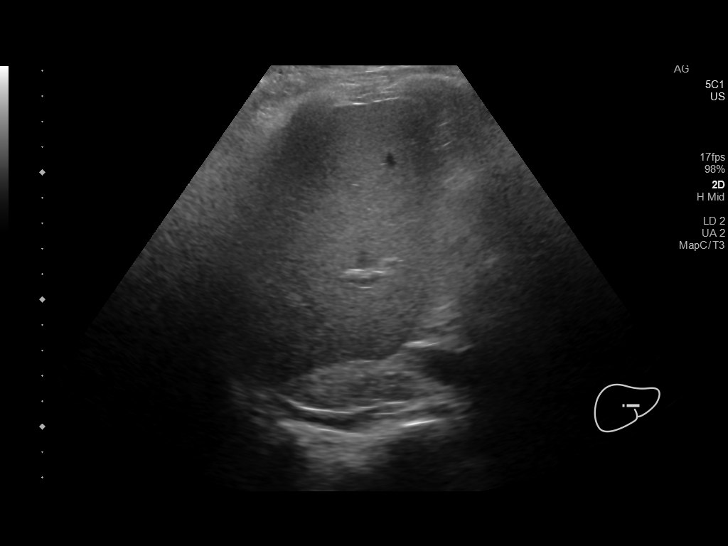
[im 49/66]
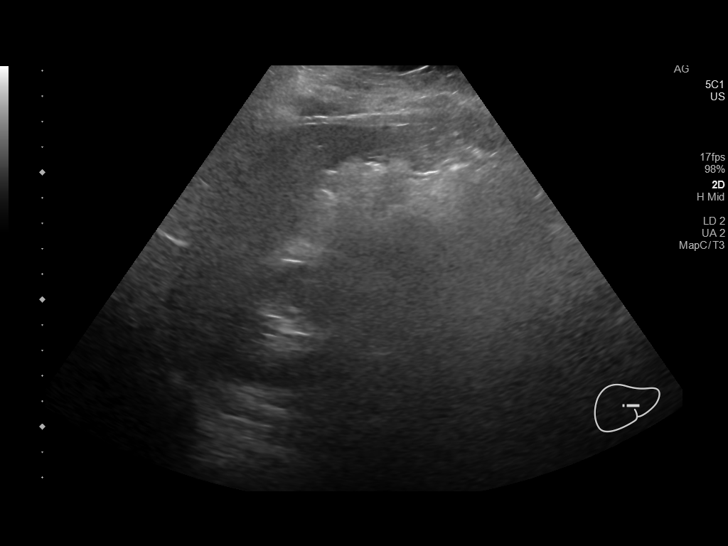
[im 55/66]
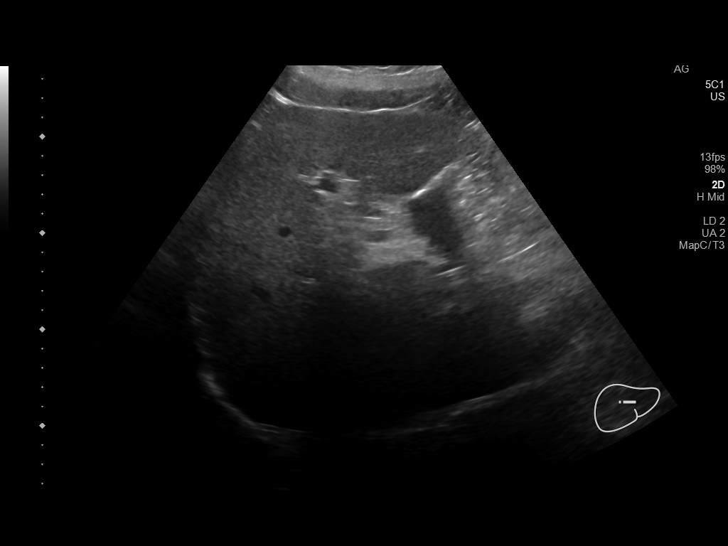
[im 60/66]
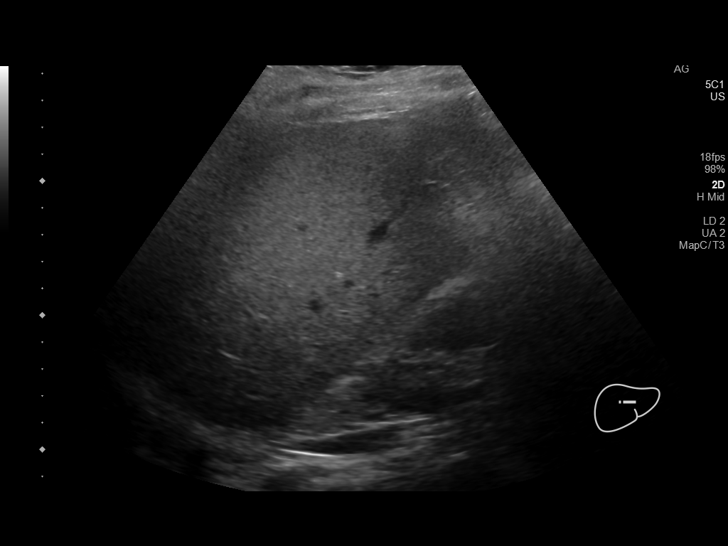
[im 66/66]
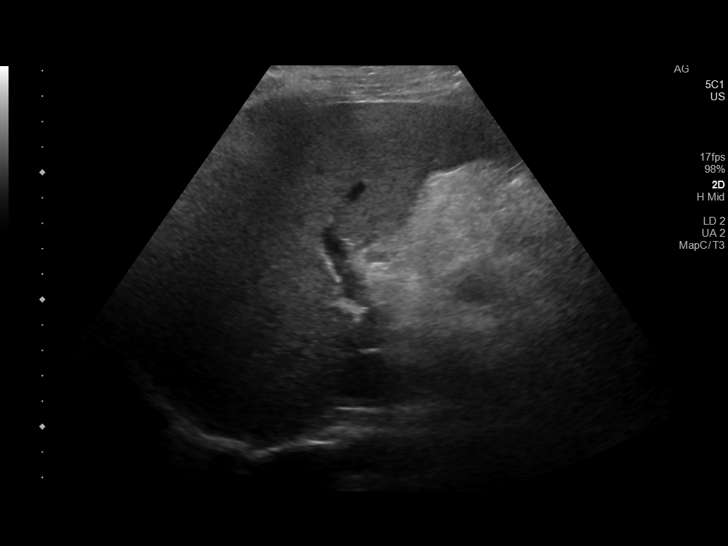

[14 of 25 positions shown; findings below may reference images not displayed]

FINDINGS: Gallbladder:

Gallbladder is contracted. No visible stones. There is gallbladder
wall thickening measuring up to 5 mm. The patient was tender over
the gallbladder during the study. Sludge seen within the
gallbladder.

Common bile duct:

Diameter: Normal caliber, 4 mm

Liver:

No focal lesion identified. Within normal limits in parenchymal
echogenicity. Portal vein is patent on color Doppler imaging with
normal direction of blood flow towards the liver.
IMPRESSION: Contracted gallbladder with a small amount of sludge within the
lumen and gallbladder wall thickening. The patient was tender over
the gallbladder during the study. Findings concerning for acute
cholecystitis. No visible stones.

## 2020-01-24 ENCOUNTER — Other Ambulatory Visit: Payer: Self-pay

## 2020-01-24 DIAGNOSIS — Z20822 Contact with and (suspected) exposure to covid-19: Secondary | ICD-10-CM

## 2020-01-26 LAB — NOVEL CORONAVIRUS, NAA: SARS-CoV-2, NAA: NOT DETECTED

## 2020-01-26 LAB — SPECIMEN STATUS REPORT

## 2020-01-26 LAB — SARS-COV-2, NAA 2 DAY TAT

## 2021-07-29 DIAGNOSIS — R69 Illness, unspecified: Secondary | ICD-10-CM | POA: Diagnosis not present

## 2021-07-31 DIAGNOSIS — Z5181 Encounter for therapeutic drug level monitoring: Secondary | ICD-10-CM | POA: Diagnosis not present

## 2021-07-31 DIAGNOSIS — Z79899 Other long term (current) drug therapy: Secondary | ICD-10-CM | POA: Diagnosis not present

## 2021-07-31 DIAGNOSIS — R69 Illness, unspecified: Secondary | ICD-10-CM | POA: Diagnosis not present

## 2021-08-02 DIAGNOSIS — R69 Illness, unspecified: Secondary | ICD-10-CM | POA: Diagnosis not present

## 2021-08-05 DIAGNOSIS — R69 Illness, unspecified: Secondary | ICD-10-CM | POA: Diagnosis not present

## 2021-08-08 DIAGNOSIS — R69 Illness, unspecified: Secondary | ICD-10-CM | POA: Diagnosis not present

## 2021-08-08 DIAGNOSIS — Z79899 Other long term (current) drug therapy: Secondary | ICD-10-CM | POA: Diagnosis not present

## 2021-08-12 DIAGNOSIS — R69 Illness, unspecified: Secondary | ICD-10-CM | POA: Diagnosis not present

## 2021-08-15 DIAGNOSIS — R69 Illness, unspecified: Secondary | ICD-10-CM | POA: Diagnosis not present

## 2021-08-15 DIAGNOSIS — Z5181 Encounter for therapeutic drug level monitoring: Secondary | ICD-10-CM | POA: Diagnosis not present

## 2021-08-15 DIAGNOSIS — Z79899 Other long term (current) drug therapy: Secondary | ICD-10-CM | POA: Diagnosis not present

## 2021-08-19 DIAGNOSIS — Z114 Encounter for screening for human immunodeficiency virus [HIV]: Secondary | ICD-10-CM | POA: Diagnosis not present

## 2021-08-19 DIAGNOSIS — Z79899 Other long term (current) drug therapy: Secondary | ICD-10-CM | POA: Diagnosis not present

## 2021-08-19 DIAGNOSIS — Z713 Dietary counseling and surveillance: Secondary | ICD-10-CM | POA: Diagnosis not present

## 2021-08-19 DIAGNOSIS — Z87898 Personal history of other specified conditions: Secondary | ICD-10-CM | POA: Diagnosis not present

## 2021-08-19 DIAGNOSIS — Z7689 Persons encountering health services in other specified circumstances: Secondary | ICD-10-CM | POA: Diagnosis not present

## 2021-08-19 DIAGNOSIS — G629 Polyneuropathy, unspecified: Secondary | ICD-10-CM | POA: Diagnosis not present

## 2021-08-19 DIAGNOSIS — Z13220 Encounter for screening for lipoid disorders: Secondary | ICD-10-CM | POA: Diagnosis not present

## 2021-08-19 DIAGNOSIS — B1920 Unspecified viral hepatitis C without hepatic coma: Secondary | ICD-10-CM | POA: Diagnosis not present

## 2021-08-19 DIAGNOSIS — E669 Obesity, unspecified: Secondary | ICD-10-CM | POA: Diagnosis not present

## 2021-08-19 DIAGNOSIS — E559 Vitamin D deficiency, unspecified: Secondary | ICD-10-CM | POA: Diagnosis not present

## 2021-08-19 DIAGNOSIS — R69 Illness, unspecified: Secondary | ICD-10-CM | POA: Diagnosis not present

## 2021-08-26 DIAGNOSIS — R69 Illness, unspecified: Secondary | ICD-10-CM | POA: Diagnosis not present

## 2021-08-30 DIAGNOSIS — F1120 Opioid dependence, uncomplicated: Secondary | ICD-10-CM | POA: Diagnosis not present

## 2021-08-30 DIAGNOSIS — R69 Illness, unspecified: Secondary | ICD-10-CM | POA: Diagnosis not present

## 2021-08-30 DIAGNOSIS — Z5181 Encounter for therapeutic drug level monitoring: Secondary | ICD-10-CM | POA: Diagnosis not present

## 2021-08-30 DIAGNOSIS — Z79899 Other long term (current) drug therapy: Secondary | ICD-10-CM | POA: Diagnosis not present

## 2021-09-02 DIAGNOSIS — R69 Illness, unspecified: Secondary | ICD-10-CM | POA: Diagnosis not present

## 2021-09-03 ENCOUNTER — Encounter: Payer: Self-pay | Admitting: Internal Medicine

## 2021-09-09 DIAGNOSIS — R69 Illness, unspecified: Secondary | ICD-10-CM | POA: Diagnosis not present

## 2021-09-13 DIAGNOSIS — F1120 Opioid dependence, uncomplicated: Secondary | ICD-10-CM | POA: Diagnosis not present

## 2021-09-13 DIAGNOSIS — Z5181 Encounter for therapeutic drug level monitoring: Secondary | ICD-10-CM | POA: Diagnosis not present

## 2021-09-13 DIAGNOSIS — R69 Illness, unspecified: Secondary | ICD-10-CM | POA: Diagnosis not present

## 2021-09-13 DIAGNOSIS — Z79899 Other long term (current) drug therapy: Secondary | ICD-10-CM | POA: Diagnosis not present

## 2021-09-16 DIAGNOSIS — R69 Illness, unspecified: Secondary | ICD-10-CM | POA: Diagnosis not present

## 2021-09-20 DIAGNOSIS — Z79899 Other long term (current) drug therapy: Secondary | ICD-10-CM | POA: Diagnosis not present

## 2021-09-20 DIAGNOSIS — F1120 Opioid dependence, uncomplicated: Secondary | ICD-10-CM | POA: Diagnosis not present

## 2021-09-20 DIAGNOSIS — R69 Illness, unspecified: Secondary | ICD-10-CM | POA: Diagnosis not present

## 2021-09-20 DIAGNOSIS — Z5181 Encounter for therapeutic drug level monitoring: Secondary | ICD-10-CM | POA: Diagnosis not present

## 2021-09-23 DIAGNOSIS — R69 Illness, unspecified: Secondary | ICD-10-CM | POA: Diagnosis not present

## 2021-09-26 DIAGNOSIS — R440 Auditory hallucinations: Secondary | ICD-10-CM | POA: Diagnosis not present

## 2021-09-26 DIAGNOSIS — F1320 Sedative, hypnotic or anxiolytic dependence, uncomplicated: Secondary | ICD-10-CM | POA: Diagnosis not present

## 2021-09-26 DIAGNOSIS — F17200 Nicotine dependence, unspecified, uncomplicated: Secondary | ICD-10-CM | POA: Diagnosis not present

## 2021-09-26 DIAGNOSIS — R69 Illness, unspecified: Secondary | ICD-10-CM | POA: Diagnosis not present

## 2021-09-26 DIAGNOSIS — F1120 Opioid dependence, uncomplicated: Secondary | ICD-10-CM | POA: Diagnosis not present

## 2021-09-26 DIAGNOSIS — R441 Visual hallucinations: Secondary | ICD-10-CM | POA: Diagnosis not present

## 2021-09-26 DIAGNOSIS — Z3202 Encounter for pregnancy test, result negative: Secondary | ICD-10-CM | POA: Diagnosis not present

## 2021-09-28 NOTE — Progress Notes (Deleted)
Referring Provider:  Shelby Dubin, FNP Primary Care Physician:  Shelby Dubin, FNP Primary Gastroenterologist:  Dr. Jena Gauss  No chief complaint on file.   HPI:   Kendra Ho is a 41 y.o. female presenting today at the request of  Bucio, Elsa C, FNP for Hepatitis C.   Past Medical History:  Diagnosis Date   Anxiety    Panic attack     Past Surgical History:  Procedure Laterality Date   CHOLECYSTECTOMY N/A 01/25/2018   Procedure: LAPAROSCOPIC CHOLECYSTECTOMY;  Surgeon: Franky Macho, MD;  Location: [facility];  Service: General;  Laterality: N/A;   LIVER BIOPSY N/A 01/25/2018   Procedure: LIVER BIOPSY;  Surgeon: Franky Macho, MD;  Location: [facility];  Service: General;  Laterality: N/A;    Current Outpatient Medications  Medication Sig Dispense Refill   busPIRone (BUSPAR) 10 MG tablet Take 10 mg by mouth 2 (two) times daily.  0   fluticasone (FLONASE) 50 MCG/ACT nasal spray Place 2 sprays into both nostrils daily as needed for allergies.      methocarbamol (ROBAXIN) 500 MG tablet Take 1 tablet (500 mg total) by mouth every 8 (eight) hours as needed for muscle spasms. 8 tablet 0   ondansetron (ZOFRAN) 4 MG tablet Take 4 mg by mouth every 6 (six) hours as needed for nausea or vomiting.     predniSONE (DELTASONE) 20 MG tablet Take 2 tablets (40 mg total) by mouth daily. 6 tablet 0   promethazine (PHENERGAN) 25 MG tablet Take 1 tablet (25 mg total) by mouth every 6 (six) hours as needed for nausea or vomiting. 30 tablet 0   QUEtiapine (SEROQUEL) 50 MG tablet Take 1 tablet by mouth at bedtime.     No current facility-administered medications for this visit.    Allergies as of 09/30/2021 - Review Complete 06/04/2018  Allergen Reaction Noted   Risperdal [risperidone] Anaphylaxis 01/14/2018   Bee venom  08/31/2017    Family History  Problem Relation Age of Onset   Obesity Mother     Social History   Socioeconomic History   Marital status: Single    Spouse name: Not on  file   Number of children: Not on file   Years of education: Not on file   Highest education level: Not on file  Occupational History   Not on file  Tobacco Use   Smoking status: Every Day    Packs/day: 1.00    Types: Cigarettes   Smokeless tobacco: Never  Vaping Use   Vaping Use: Never used  Substance and Sexual Activity   Alcohol use: Not Currently    Comment: 12 pack 1 week ago   Drug use: No   Sexual activity: Yes    Birth control/protection: Implant  Other Topics Concern   Not on file  Social History Narrative   Not on file   Social Determinants of Health   Financial Resource Strain: Not on file  Food Insecurity: Not on file  Transportation Needs: Not on file  Physical Activity: Not on file  Stress: Not on file  Social Connections: Not on file  Intimate Partner Violence: Not on file    Review of Systems: Gen: Denies any fever, chills, cold or flulike symptoms, presyncope, syncope. CV: Denies chest pain, heart palpitations. Resp: Denies shortness of breath, cough.  GI: See HPI GU : Denies urinary burning, urinary frequency, urinary hesitancy MS: Denies joint pain. Derm: Denies rash. Psych: Denies depression, anxiety. Heme: See HPI  Physical Exam:  There were no vitals taken for this visit. General:   Alert and oriented. Pleasant and cooperative. Well-nourished and well-developed.  Head:  Normocephalic and atraumatic. Eyes:  Without icterus, sclera clear and conjunctiva pink.  Ears:  Normal auditory acuity. Lungs:  Clear to auscultation bilaterally. No wheezes, rales, or rhonchi. No distress.  Heart:  S1, S2 present without murmurs appreciated.  Abdomen:  +BS, soft, non-tender and non-distended. No HSM noted. No guarding or rebound. No masses appreciated.  Rectal:  Deferred  Msk:  Symmetrical without gross deformities. Normal posture. Extremities:  Without edema. Neurologic:  Alert and  oriented x4;  grossly normal neurologically. Skin:  Intact without  significant lesions or rashes. Psych:  Normal mood and affect.    Assessment:     Plan:  ***   Ermalinda Memos, PA-C [facility] 09/30/2021

## 2021-09-30 ENCOUNTER — Ambulatory Visit: Payer: Self-pay | Admitting: Gastroenterology

## 2021-10-02 DIAGNOSIS — F419 Anxiety disorder, unspecified: Secondary | ICD-10-CM | POA: Diagnosis not present

## 2021-10-02 DIAGNOSIS — G629 Polyneuropathy, unspecified: Secondary | ICD-10-CM | POA: Diagnosis not present

## 2021-10-02 DIAGNOSIS — R69 Illness, unspecified: Secondary | ICD-10-CM | POA: Diagnosis not present

## 2021-10-03 DIAGNOSIS — F1320 Sedative, hypnotic or anxiolytic dependence, uncomplicated: Secondary | ICD-10-CM | POA: Diagnosis not present

## 2021-10-03 DIAGNOSIS — F1510 Other stimulant abuse, uncomplicated: Secondary | ICD-10-CM | POA: Diagnosis not present

## 2021-10-03 DIAGNOSIS — F419 Anxiety disorder, unspecified: Secondary | ICD-10-CM | POA: Diagnosis not present

## 2021-10-03 DIAGNOSIS — R69 Illness, unspecified: Secondary | ICD-10-CM | POA: Diagnosis not present

## 2021-10-03 DIAGNOSIS — F1021 Alcohol dependence, in remission: Secondary | ICD-10-CM | POA: Diagnosis not present

## 2021-10-09 DIAGNOSIS — F1420 Cocaine dependence, uncomplicated: Secondary | ICD-10-CM | POA: Diagnosis not present

## 2021-10-09 DIAGNOSIS — R69 Illness, unspecified: Secondary | ICD-10-CM | POA: Diagnosis not present

## 2021-10-09 DIAGNOSIS — F1120 Opioid dependence, uncomplicated: Secondary | ICD-10-CM | POA: Diagnosis not present

## 2021-10-09 DIAGNOSIS — F17200 Nicotine dependence, unspecified, uncomplicated: Secondary | ICD-10-CM | POA: Diagnosis not present

## 2021-10-09 DIAGNOSIS — F32A Depression, unspecified: Secondary | ICD-10-CM | POA: Diagnosis not present

## 2021-10-10 DIAGNOSIS — F1021 Alcohol dependence, in remission: Secondary | ICD-10-CM | POA: Diagnosis not present

## 2021-10-10 DIAGNOSIS — F419 Anxiety disorder, unspecified: Secondary | ICD-10-CM | POA: Diagnosis not present

## 2021-10-10 DIAGNOSIS — R69 Illness, unspecified: Secondary | ICD-10-CM | POA: Diagnosis not present

## 2021-10-10 DIAGNOSIS — F1320 Sedative, hypnotic or anxiolytic dependence, uncomplicated: Secondary | ICD-10-CM | POA: Diagnosis not present

## 2021-10-14 DIAGNOSIS — R69 Illness, unspecified: Secondary | ICD-10-CM | POA: Diagnosis not present

## 2021-10-15 DIAGNOSIS — R69 Illness, unspecified: Secondary | ICD-10-CM | POA: Diagnosis not present

## 2021-10-16 DIAGNOSIS — R441 Visual hallucinations: Secondary | ICD-10-CM | POA: Diagnosis not present

## 2021-10-16 DIAGNOSIS — R69 Illness, unspecified: Secondary | ICD-10-CM | POA: Diagnosis not present

## 2021-10-16 DIAGNOSIS — R440 Auditory hallucinations: Secondary | ICD-10-CM | POA: Diagnosis not present

## 2021-10-17 DIAGNOSIS — R69 Illness, unspecified: Secondary | ICD-10-CM | POA: Diagnosis not present

## 2021-10-23 DIAGNOSIS — R441 Visual hallucinations: Secondary | ICD-10-CM | POA: Diagnosis not present

## 2021-10-23 DIAGNOSIS — R440 Auditory hallucinations: Secondary | ICD-10-CM | POA: Diagnosis not present

## 2021-10-23 DIAGNOSIS — R69 Illness, unspecified: Secondary | ICD-10-CM | POA: Diagnosis not present

## 2021-10-28 DIAGNOSIS — F1021 Alcohol dependence, in remission: Secondary | ICD-10-CM | POA: Diagnosis not present

## 2021-10-28 DIAGNOSIS — F419 Anxiety disorder, unspecified: Secondary | ICD-10-CM | POA: Diagnosis not present

## 2021-10-28 DIAGNOSIS — R69 Illness, unspecified: Secondary | ICD-10-CM | POA: Diagnosis not present

## 2021-10-28 DIAGNOSIS — F1320 Sedative, hypnotic or anxiolytic dependence, uncomplicated: Secondary | ICD-10-CM | POA: Diagnosis not present

## 2021-10-29 DIAGNOSIS — R69 Illness, unspecified: Secondary | ICD-10-CM | POA: Diagnosis not present

## 2021-10-30 DIAGNOSIS — R441 Visual hallucinations: Secondary | ICD-10-CM | POA: Diagnosis not present

## 2021-10-30 DIAGNOSIS — R440 Auditory hallucinations: Secondary | ICD-10-CM | POA: Diagnosis not present

## 2021-10-30 DIAGNOSIS — R69 Illness, unspecified: Secondary | ICD-10-CM | POA: Diagnosis not present

## 2021-11-04 DIAGNOSIS — F1510 Other stimulant abuse, uncomplicated: Secondary | ICD-10-CM | POA: Diagnosis not present

## 2021-11-04 DIAGNOSIS — F419 Anxiety disorder, unspecified: Secondary | ICD-10-CM | POA: Diagnosis not present

## 2021-11-04 DIAGNOSIS — F1021 Alcohol dependence, in remission: Secondary | ICD-10-CM | POA: Diagnosis not present

## 2021-11-04 DIAGNOSIS — R69 Illness, unspecified: Secondary | ICD-10-CM | POA: Diagnosis not present

## 2021-11-04 DIAGNOSIS — F1320 Sedative, hypnotic or anxiolytic dependence, uncomplicated: Secondary | ICD-10-CM | POA: Diagnosis not present

## 2021-11-12 DIAGNOSIS — R69 Illness, unspecified: Secondary | ICD-10-CM | POA: Diagnosis not present

## 2022-01-03 DIAGNOSIS — R69 Illness, unspecified: Secondary | ICD-10-CM | POA: Diagnosis not present

## 2022-01-03 DIAGNOSIS — Z792 Long term (current) use of antibiotics: Secondary | ICD-10-CM | POA: Diagnosis not present

## 2022-01-03 DIAGNOSIS — R42 Dizziness and giddiness: Secondary | ICD-10-CM | POA: Diagnosis not present

## 2022-01-03 DIAGNOSIS — X58XXXA Exposure to other specified factors, initial encounter: Secondary | ICD-10-CM | POA: Diagnosis not present

## 2022-01-03 DIAGNOSIS — R002 Palpitations: Secondary | ICD-10-CM | POA: Diagnosis not present

## 2022-01-03 DIAGNOSIS — Z79899 Other long term (current) drug therapy: Secondary | ICD-10-CM | POA: Diagnosis not present

## 2022-01-03 DIAGNOSIS — M79605 Pain in left leg: Secondary | ICD-10-CM | POA: Diagnosis not present

## 2022-01-03 DIAGNOSIS — S80812A Abrasion, left lower leg, initial encounter: Secondary | ICD-10-CM | POA: Diagnosis not present

## 2022-01-03 DIAGNOSIS — L03116 Cellulitis of left lower limb: Secondary | ICD-10-CM | POA: Diagnosis not present

## 2022-01-08 DIAGNOSIS — B353 Tinea pedis: Secondary | ICD-10-CM | POA: Diagnosis not present

## 2022-01-08 DIAGNOSIS — Z23 Encounter for immunization: Secondary | ICD-10-CM | POA: Diagnosis not present

## 2022-01-08 DIAGNOSIS — Z79899 Other long term (current) drug therapy: Secondary | ICD-10-CM | POA: Diagnosis not present

## 2022-01-08 DIAGNOSIS — M7989 Other specified soft tissue disorders: Secondary | ICD-10-CM | POA: Diagnosis not present

## 2022-01-08 DIAGNOSIS — R69 Illness, unspecified: Secondary | ICD-10-CM | POA: Diagnosis not present

## 2022-01-08 DIAGNOSIS — R42 Dizziness and giddiness: Secondary | ICD-10-CM | POA: Diagnosis not present

## 2022-01-08 DIAGNOSIS — L03116 Cellulitis of left lower limb: Secondary | ICD-10-CM | POA: Diagnosis not present

## 2022-02-19 ENCOUNTER — Ambulatory Visit: Payer: Self-pay | Admitting: Internal Medicine

## 2022-02-27 DIAGNOSIS — R69 Illness, unspecified: Secondary | ICD-10-CM | POA: Diagnosis not present

## 2022-03-13 DIAGNOSIS — R69 Illness, unspecified: Secondary | ICD-10-CM | POA: Diagnosis not present

## 2022-03-19 DIAGNOSIS — R69 Illness, unspecified: Secondary | ICD-10-CM | POA: Diagnosis not present

## 2022-03-27 DIAGNOSIS — R69 Illness, unspecified: Secondary | ICD-10-CM | POA: Diagnosis not present

## 2022-03-27 DIAGNOSIS — E876 Hypokalemia: Secondary | ICD-10-CM | POA: Diagnosis not present

## 2022-03-27 DIAGNOSIS — Z0000 Encounter for general adult medical examination without abnormal findings: Secondary | ICD-10-CM | POA: Diagnosis not present

## 2022-03-27 DIAGNOSIS — K644 Residual hemorrhoidal skin tags: Secondary | ICD-10-CM | POA: Diagnosis not present

## 2022-03-27 DIAGNOSIS — Z124 Encounter for screening for malignant neoplasm of cervix: Secondary | ICD-10-CM | POA: Diagnosis not present

## 2022-03-27 DIAGNOSIS — Z1231 Encounter for screening mammogram for malignant neoplasm of breast: Secondary | ICD-10-CM | POA: Diagnosis not present

## 2022-03-28 ENCOUNTER — Other Ambulatory Visit (HOSPITAL_COMMUNITY): Payer: Self-pay | Admitting: Family Medicine

## 2022-03-28 DIAGNOSIS — Z1231 Encounter for screening mammogram for malignant neoplasm of breast: Secondary | ICD-10-CM

## 2022-04-07 ENCOUNTER — Ambulatory Visit (HOSPITAL_COMMUNITY): Payer: Self-pay

## 2023-05-18 DIAGNOSIS — Z716 Tobacco abuse counseling: Secondary | ICD-10-CM | POA: Diagnosis not present

## 2023-05-18 DIAGNOSIS — F17210 Nicotine dependence, cigarettes, uncomplicated: Secondary | ICD-10-CM | POA: Diagnosis not present

## 2023-07-02 DIAGNOSIS — F1120 Opioid dependence, uncomplicated: Secondary | ICD-10-CM | POA: Diagnosis not present

## 2023-07-02 DIAGNOSIS — Z79899 Other long term (current) drug therapy: Secondary | ICD-10-CM | POA: Diagnosis not present

## 2023-08-18 DIAGNOSIS — Z7251 High risk heterosexual behavior: Secondary | ICD-10-CM | POA: Diagnosis not present

## 2023-08-18 DIAGNOSIS — N898 Other specified noninflammatory disorders of vagina: Secondary | ICD-10-CM | POA: Diagnosis not present

## 2023-09-09 DIAGNOSIS — Z79899 Other long term (current) drug therapy: Secondary | ICD-10-CM | POA: Diagnosis not present

## 2023-09-09 DIAGNOSIS — F1120 Opioid dependence, uncomplicated: Secondary | ICD-10-CM | POA: Diagnosis not present

## 2023-09-17 DIAGNOSIS — F1120 Opioid dependence, uncomplicated: Secondary | ICD-10-CM | POA: Diagnosis not present

## 2023-09-17 DIAGNOSIS — Z5181 Encounter for therapeutic drug level monitoring: Secondary | ICD-10-CM | POA: Diagnosis not present

## 2023-09-24 DIAGNOSIS — Z79899 Other long term (current) drug therapy: Secondary | ICD-10-CM | POA: Diagnosis not present

## 2023-09-24 DIAGNOSIS — F1120 Opioid dependence, uncomplicated: Secondary | ICD-10-CM | POA: Diagnosis not present

## 2023-10-08 DIAGNOSIS — F1120 Opioid dependence, uncomplicated: Secondary | ICD-10-CM | POA: Diagnosis not present

## 2023-10-08 DIAGNOSIS — Z79899 Other long term (current) drug therapy: Secondary | ICD-10-CM | POA: Diagnosis not present

## 2023-10-22 DIAGNOSIS — F1120 Opioid dependence, uncomplicated: Secondary | ICD-10-CM | POA: Diagnosis not present

## 2023-10-22 DIAGNOSIS — Z79899 Other long term (current) drug therapy: Secondary | ICD-10-CM | POA: Diagnosis not present

## 2023-12-07 DIAGNOSIS — F1120 Opioid dependence, uncomplicated: Secondary | ICD-10-CM | POA: Diagnosis not present

## 2024-01-27 DIAGNOSIS — F419 Anxiety disorder, unspecified: Secondary | ICD-10-CM | POA: Diagnosis not present

## 2024-01-27 DIAGNOSIS — L989 Disorder of the skin and subcutaneous tissue, unspecified: Secondary | ICD-10-CM | POA: Diagnosis not present

## 2024-01-27 DIAGNOSIS — L03311 Cellulitis of abdominal wall: Secondary | ICD-10-CM | POA: Diagnosis not present

## 2024-01-27 DIAGNOSIS — Z79899 Other long term (current) drug therapy: Secondary | ICD-10-CM | POA: Diagnosis not present

## 2024-02-17 ENCOUNTER — Encounter (HOSPITAL_COMMUNITY): Payer: Self-pay

## 2024-02-17 ENCOUNTER — Other Ambulatory Visit: Payer: Self-pay

## 2024-02-17 ENCOUNTER — Emergency Department (HOSPITAL_COMMUNITY)
Admission: EM | Admit: 2024-02-17 | Discharge: 2024-02-17 | Disposition: A | Discharge status: Home / Self Care | Payer: MEDICAID | Attending: Emergency Medicine | Admitting: Emergency Medicine

## 2024-02-17 DIAGNOSIS — R21 Rash and other nonspecific skin eruption: Secondary | ICD-10-CM | POA: Diagnosis present

## 2024-02-17 DIAGNOSIS — Z79899 Other long term (current) drug therapy: Secondary | ICD-10-CM | POA: Insufficient documentation

## 2024-02-17 MED ORDER — PERMETHRIN 5 % EX CREA: TOPICAL_CREAM | CUTANEOUS | 0 refills | Status: AC

## 2024-02-17 NOTE — ED Triage Notes (Signed)
 [facility] from [facility]. Cc of bug bites to right side of neck. Has been seen at different facilities for it. Says cold makes it worse.

## 2024-02-17 NOTE — ED Provider Notes (Signed)
" °  [place] EMERGENCY DEPARTMENT AT [facility] Provider Note   CSN: [id number] Arrival date & time: 02/17/24  2212     Patient presents with: Insect Bite   Kendra Ho is a 43 y.o. female.   The history is provided by the patient.  Patient presents with possible bug bite she reports he had a rash throughout the body at different times.  Now it is in the right side of the neck. No fevers.  No other acute complaints    Prior to Admission medications  Medication Sig Start Date End Date Taking? Authorizing Provider  permethrin (ELIMITE) 5 % cream Apply to affected area once - leave for 8 hours then rinse.  May repeat once in 14 days 02/17/24  Yes Midge Golas, MD  busPIRone  (BUSPAR ) 10 MG tablet Take 10 mg by mouth 2 (two) times daily. 12/30/17   [provider]  fluticasone (FLONASE) 50 MCG/ACT nasal spray Place 2 sprays into both nostrils daily as needed for allergies.     [provider]  QUEtiapine  (SEROQUEL ) 50 MG tablet Take 1 tablet by mouth at bedtime. 10/30/17   [provider]    Allergies: Risperdal [risperidone] and Bee venom    Review of Systems  Updated Vital Signs BP (!) 149/91 (BP Location: Right Arm)   Pulse 96   Temp 98.1 F (36.7 C) (Oral)   Resp 20   Ht 1.524 m (5')   Wt 85 kg   SpO2 97%   BMI 36.60 kg/m   Physical Exam CONSTITUTIONAL: Disheveled HEAD: Normocephalic/atraumatic EYES: EOMI/PERRL ENMT: Mucous membranes moist, no angioedema, no stridor NECK: supple no meningeal signs NEURO: Pt is awake/alert/appropriate, moves all extremitiesx4.  No facial droop.   SKIN: Raised erythematous rash noted to the right side of her neck.  No abscess, no fluctuance, no crepitance no swelling No rash noted to hands or feet (all labs ordered are listed, but only abnormal results are displayed) Labs Reviewed - No data to display  EKG: None  Radiology: No results found.   Procedures   Medications  Ordered in the ED - No data to display                                  Medical Decision Making Risk Prescription drug management.   Patient states she has bug bite.  reports she had rash in other parts of her body.  Now on the right side of her neck.  Does not appear to be abscess or cellulitis.  No other signs of allergic reaction  She does not recall using permethrin for this.  Will trial this, we discussed appropriate use.  Patient safer discharge     Final diagnoses:  Rash    ED Discharge Orders          Ordered    permethrin (ELIMITE) 5 % cream        02/17/24 2331               Midge Golas, MD 02/17/24 2333  "

## 2024-02-18 ENCOUNTER — Emergency Department (HOSPITAL_COMMUNITY)
Admission: EM | Admit: 2024-02-18 | Discharge: 2024-02-18 | Disposition: A | Discharge status: Home / Self Care | Payer: MEDICAID | Source: Skilled Nursing Facility | Attending: Emergency Medicine | Admitting: Emergency Medicine

## 2024-02-18 ENCOUNTER — Encounter (HOSPITAL_COMMUNITY): Payer: Self-pay

## 2024-02-18 ENCOUNTER — Other Ambulatory Visit: Payer: Self-pay

## 2024-02-18 DIAGNOSIS — Z760 Encounter for issue of repeat prescription: Secondary | ICD-10-CM | POA: Insufficient documentation

## 2024-02-18 DIAGNOSIS — Z9229 Personal history of other drug therapy: Secondary | ICD-10-CM

## 2024-02-18 MED ORDER — BUSPIRONE HCL 5 MG PO TABS
10.0000 mg | ORAL_TABLET | Freq: Two times a day (BID) | ORAL | Status: DC
  Administered 2024-02-18: 10 mg via ORAL
  Filled 2024-02-18: qty 2

## 2024-02-18 MED ORDER — BUPRENORPHINE HCL-NALOXONE HCL 8-2 MG SL SUBL
1.0000 | SUBLINGUAL_TABLET | Freq: Once | SUBLINGUAL | Status: AC
  Administered 2024-02-18: 1 via SUBLINGUAL
  Filled 2024-02-18: qty 1

## 2024-02-18 NOTE — Discharge Instructions (Signed)
 Follow-up emergency department for any new or worsening symptoms.  Otherwise he reports started position for chronic management of your medications.

## 2024-02-18 NOTE — ED Triage Notes (Signed)
 Pt arrived via [facility] from [facility] for evaluation of needing assistance with Rehab due to relapsing with Meth and needing assistance with getting back on her medications. Pt reports difficulty with money and transportation as well.

## 2024-02-18 NOTE — ED Provider Notes (Signed)
 " [name] Provider Note   CSN: [id number] Arrival date & time: 02/18/24  1243     Patient presents with: Medication Refill   Kendra Ho is a 44 y.o. female with a past medical history of substance abuse.  She reports that she needs a dose of her Suboxone .  She has not been able to get to see her prescriber because she relapsed on her methamphetamine and was on the streets but is currently plan to get herself back on track.  She reports that she is going to see the provider this afternoon but would like to place this morning.  She has not had the prescription for the Suboxone  since the end of October but reports she has been buying them daily on the street make sure she does not go into withdrawal.  She would also like a dose of her regular medications.  She has no other complaints at this time.  She has not specifically been to rehab today.  She would like to discuss her medications if she can get a ride to see the provider in the afternoon.    Medication Refill      Prior to Admission medications  Medication Sig Start Date End Date Taking? Authorizing Provider  busPIRone  (BUSPAR ) 10 MG tablet Take 10 mg by mouth 2 (two) times daily. 12/30/17   [provider]  fluticasone (FLONASE) 50 MCG/ACT nasal spray Place 2 sprays into both nostrils daily as needed for allergies.     [provider]  permethrin  (ELIMITE ) 5 % cream Apply to affected area once - leave for 8 hours then rinse.  May repeat once in 14 days 02/17/24   Midge Golas, MD  QUEtiapine  (SEROQUEL ) 50 MG tablet Take 1 tablet by mouth at bedtime. 10/30/17   [provider]    Allergies: Risperdal [risperidone] and Bee venom    Review of Systems  Updated Vital Signs BP 118/77 (BP Location: Right Arm)   Pulse 79   Temp 98 F (36.7 C)   Resp 17   Ht 5' (1.524 m)   Wt 63.5 kg   SpO2 100%   BMI 27.34 kg/m   Physical Exam Vitals and  nursing note reviewed.  Constitutional:      General: She is not in acute distress.    Appearance: She is well-developed. She is not diaphoretic.  HENT:     Head: Normocephalic and atraumatic.     Comments: edentulous    Right Ear: External ear normal.     Left Ear: External ear normal.     Nose: Nose normal.     Mouth/Throat:     Mouth: Mucous membranes are moist.  Eyes:     General: No scleral icterus.    Conjunctiva/sclera: Conjunctivae normal.  Cardiovascular:     Rate and Rhythm: Normal rate and regular rhythm.     Heart sounds: Normal heart sounds. No murmur heard.    No friction rub. No gallop.  Pulmonary:     Effort: Pulmonary effort is normal. No respiratory distress.     Breath sounds: Normal breath sounds.  Abdominal:     General: Bowel sounds are normal. There is no distension.     Palpations: Abdomen is soft. There is no mass.     Tenderness: There is no abdominal tenderness. There is no guarding.  Musculoskeletal:     Cervical back: Normal range of motion.  Skin:    General: Skin is  warm and dry.  Neurological:     Mental Status: She is alert and oriented to person, place, and time.  Psychiatric:        Behavior: Behavior is hyperactive.     (all labs ordered are listed, but only abnormal results are displayed) Labs Reviewed - No data to display  EKG: None  Radiology: No results found.   Procedures   Medications Ordered in the ED  busPIRone  (BUSPAR ) tablet 10 mg (10 mg Oral Given 02/18/24 1357)  buprenorphine -naloxone  (SUBOXONE ) 8-2 mg per SL tablet 1 tablet (1 tablet Sublingual Given 02/18/24 1357)                                    Medical Decision Making Risk Prescription drug management.   Patient given a dose of her requested medications. No other complaints at this time. She appears approprate for discharge with op follow up with her prescribing physician.      Final diagnoses:  None    ED Discharge Orders     None           Arloa Chroman, PA-C 02/18/24 1454    Yolande Lamar BROCKS, MD 02/24/24 2033  "
